# Patient Record
Sex: Male | Born: 1940 | Race: White | Hispanic: No | Smoking: Current some day smoker
Health system: Southern US, Community
[De-identification: ages and names within clinical notes are randomized; demographics above are authoritative.]

## PROBLEM LIST (undated history)

## (undated) DIAGNOSIS — H409 Unspecified glaucoma: Secondary | ICD-10-CM

## (undated) DIAGNOSIS — I10 Essential (primary) hypertension: Secondary | ICD-10-CM

## (undated) DIAGNOSIS — E785 Hyperlipidemia, unspecified: Secondary | ICD-10-CM

## (undated) HISTORY — PX: ANGIOPLASTY: SHX39

## (undated) HISTORY — DX: Unspecified glaucoma: H40.9

## (undated) HISTORY — PX: OTHER SURGICAL HISTORY: SHX169

## (undated) HISTORY — DX: Hyperlipidemia, unspecified: E78.5

## (undated) HISTORY — DX: Essential (primary) hypertension: I10

## (undated) HISTORY — PX: APPENDECTOMY: SHX54

---

## 2006-02-26 ENCOUNTER — Encounter: Admission: RE | Admit: 2006-02-26 | Discharge: 2006-02-26 | Payer: Self-pay | Admitting: Internal Medicine

## 2007-03-02 ENCOUNTER — Ambulatory Visit: Payer: Self-pay | Admitting: Internal Medicine

## 2007-03-10 ENCOUNTER — Ambulatory Visit: Payer: Self-pay | Admitting: Internal Medicine

## 2007-03-10 ENCOUNTER — Encounter: Payer: Self-pay | Admitting: Internal Medicine

## 2007-03-10 DIAGNOSIS — K5730 Diverticulosis of large intestine without perforation or abscess without bleeding: Secondary | ICD-10-CM | POA: Insufficient documentation

## 2007-03-10 DIAGNOSIS — D126 Benign neoplasm of colon, unspecified: Secondary | ICD-10-CM

## 2007-03-10 DIAGNOSIS — K648 Other hemorrhoids: Secondary | ICD-10-CM | POA: Insufficient documentation

## 2007-11-23 DIAGNOSIS — I10 Essential (primary) hypertension: Secondary | ICD-10-CM | POA: Insufficient documentation

## 2007-11-23 DIAGNOSIS — I2510 Atherosclerotic heart disease of native coronary artery without angina pectoris: Secondary | ICD-10-CM | POA: Insufficient documentation

## 2007-11-23 DIAGNOSIS — E785 Hyperlipidemia, unspecified: Secondary | ICD-10-CM

## 2008-01-06 IMAGING — CR DG ANKLE COMPLETE 3+V*R*
3 series · 3 of 3 positions shown · non-contrast
Comparison: None.

CLINICAL DATA: Pain and swelling.  Medial ankle pain for a couple of days.  Question gout.  Old fracture from many years ago.  No trauma history submitted.  
RIGHT ANKLE ? 3 VIEW:

[view not recorded (1 of 3)]
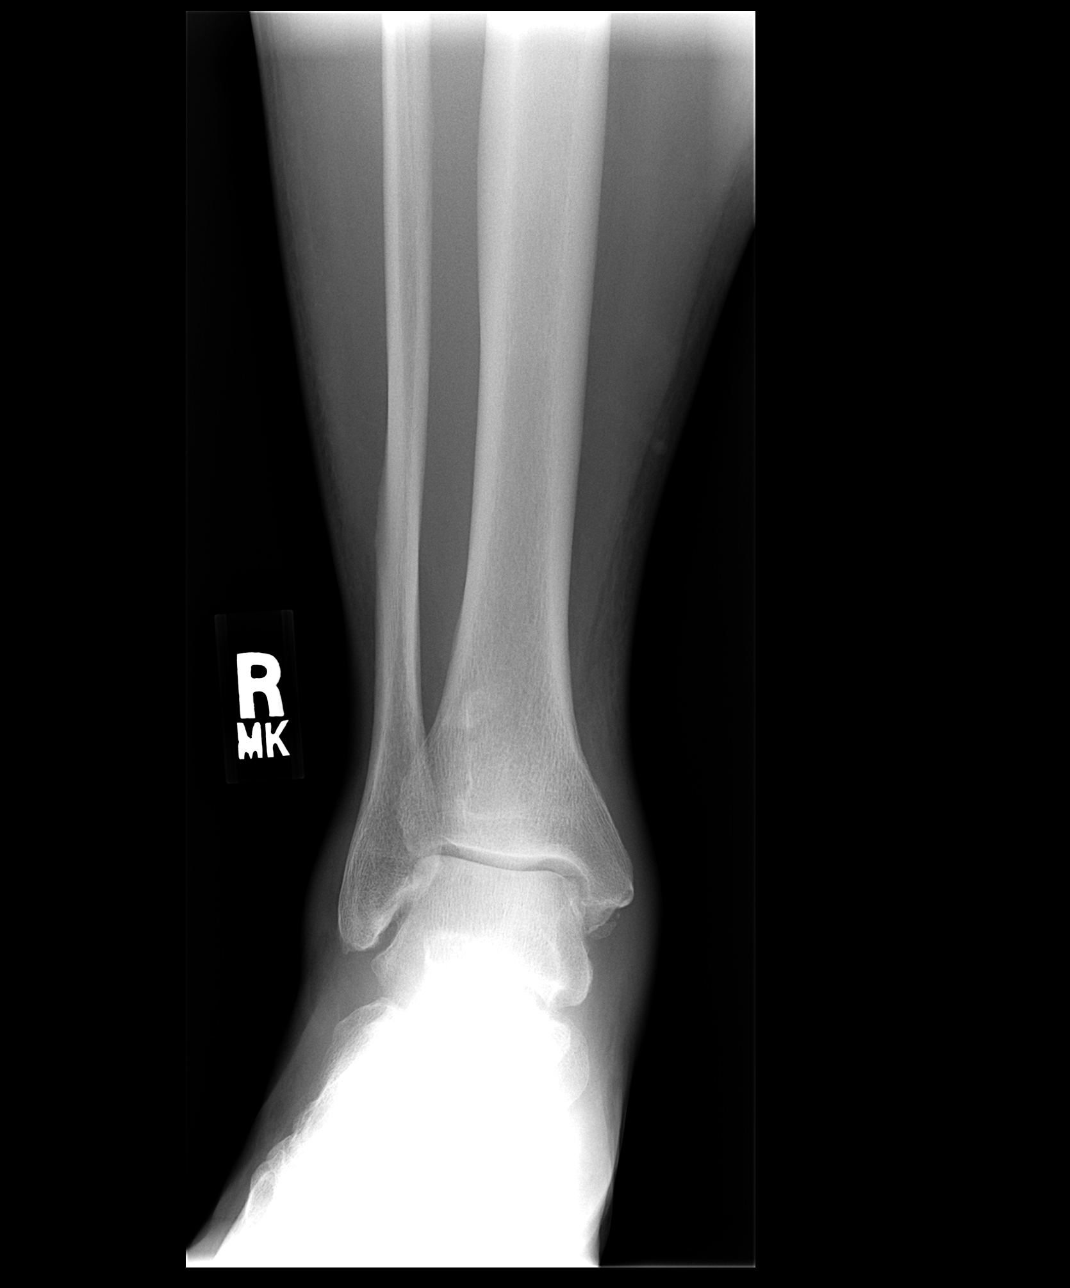

[view not recorded (2 of 3)]
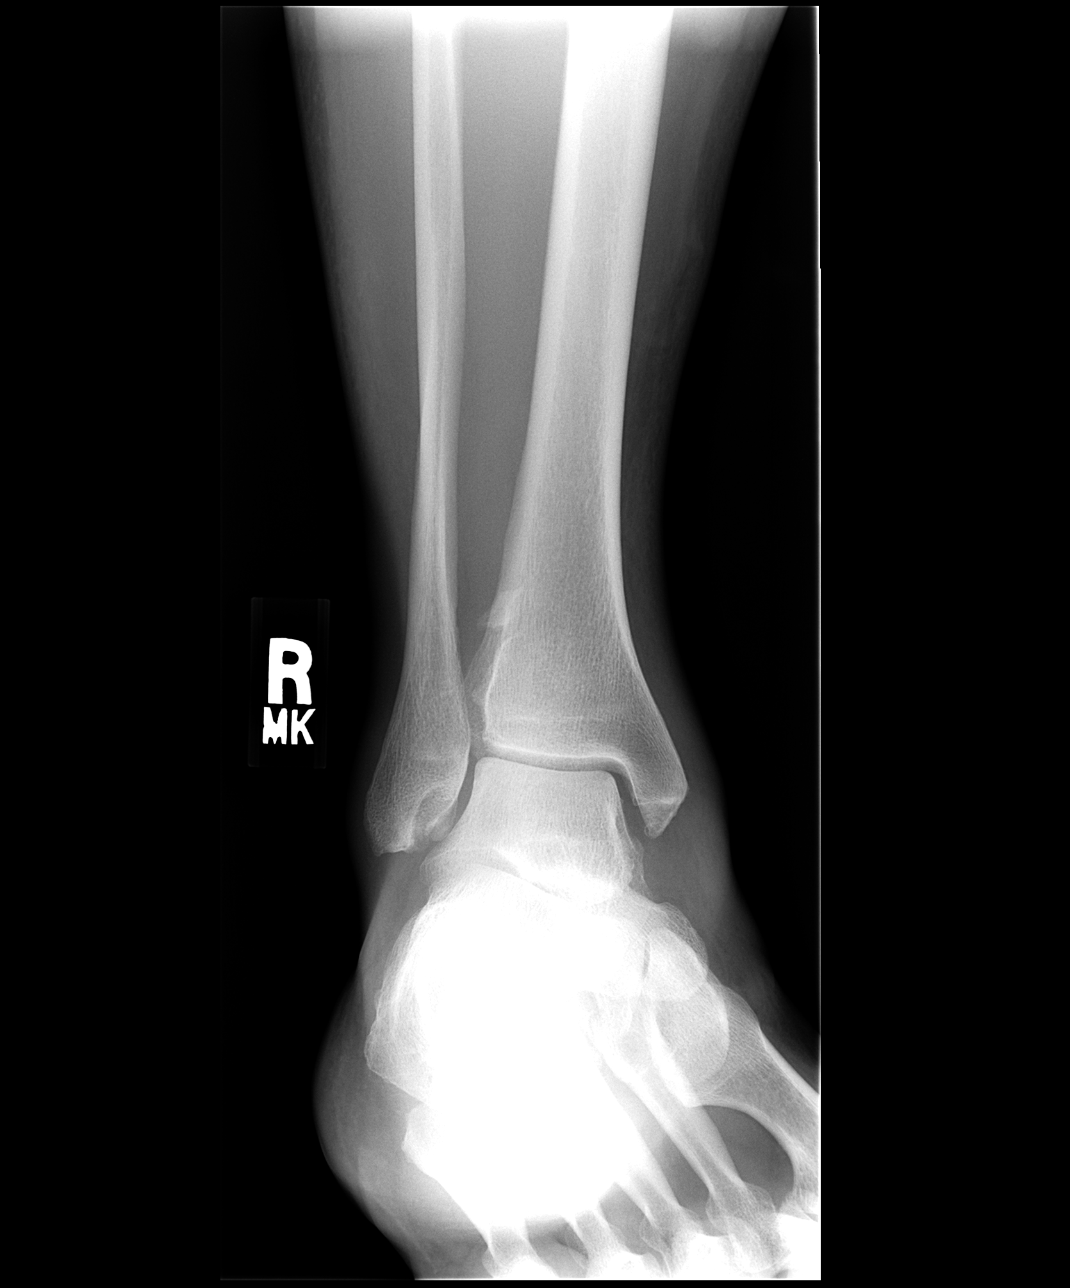

[view not recorded (3 of 3)]
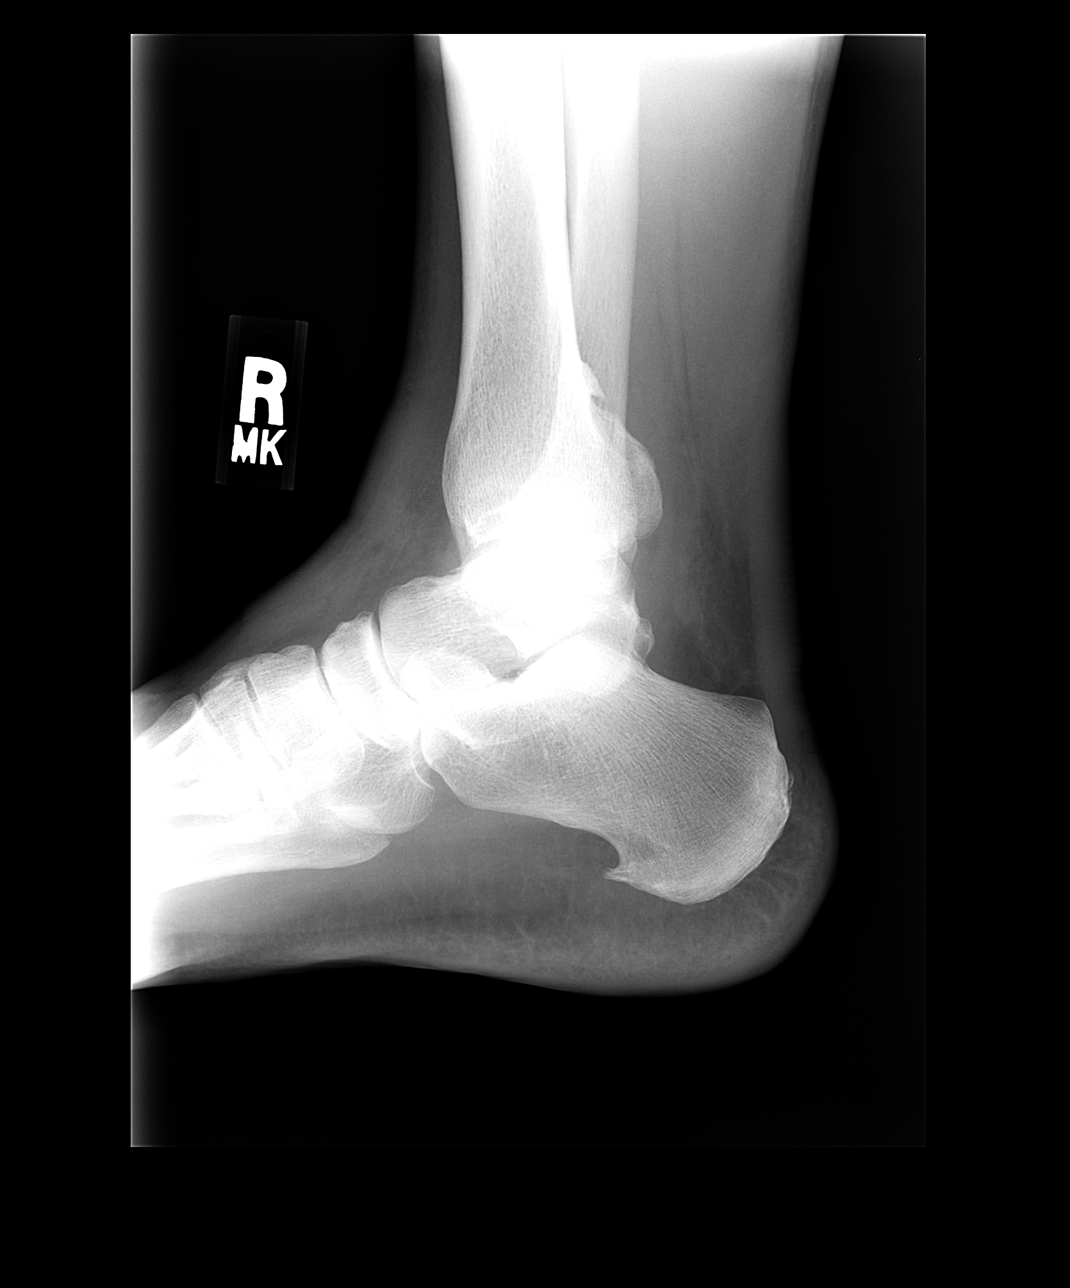

[3 of 3 positions shown; findings below may reference images not displayed]

FINDINGS: Frontal view demonstrates at least one small ossific fragment distal to the medial malleolus.  There may be mild soft tissue swelling about the medial malleolus.  On the mortise view, there is osseous irregularity about the lateral malleolus likely related to the reported history of remote trauma.  The talar dome is intact.  The base of the fifth metatarsal is intact.  There are no erosions .  Lateral view demonstrates a prominent exostosis off of the posterior distal tibia which could relate to the reported history of remote trauma.  There is no significant joint effusion.  A calcaneal spur is incidentally noted.
IMPRESSION: 1.  Small ossific fragments distal to the medial and lateral malleoli.  Clinical history describes no history of recent trauma.  Therefore, these are likely related to remote trauma.  There is a suggestion of mild medial  soft tissue swelling.  
2.  Exostosis off of the posterior aspect of the distal tibia is likely related to remote trauma.

## 2010-12-23 NOTE — Assessment & Plan Note (Signed)
[facility]                         GASTROENTEROLOGY OFFICE NOTE   SHIMON, TROWBRIDGE                       MRN:          [id number]  DATE:03/02/2007                            DOB:          1941-04-24    REASON FOR CONSULTATION:  Hemoccult positive stool.   HISTORY:  This is a 70 year old white male with a history of coronary  artery disease for which he has undergone coronary artery angioplasty on  multiple occasions.  He also has a history of hypertension and  hyperlipidemia.  He is referred through the courtesy of Dr. Felipa Eth  regarding Hemoccult positive stool identified on a routine outpatient  testing.  Patient tells me that he is not anemic.  His GI review of  systems is quite unremarkable.  He does have rare heartburn with dietary  indiscretion, particularly [place] foods.  Otherwise no problems with  nausea, vomiting, dysphagia, abdominal pain, change in bowel habits,  melena, or hematochezia.  His weight and appetite have been stable.  He  does report that he underwent colonoscopy in 1990 in [place], [place].  He is not exactly certain of the indication though felt the results were  okay.  No other prior GI history or GI evaluations.   PAST MEDICAL HISTORY:  1. Hypertension.  2. Coronary artery disease, status post angioplasty x4 in 1990's and      again in 1997.  3. Hyperlipidemia.   PAST SURGICAL HISTORY:  Status post appendectomy.   ALLERGIES:  NO KNOWN DRUG ALLERGIES.   CURRENT MEDICATIONS:  1. Diltiazem 300 mg p.o. q.a.m.  2. Atenolol 100 mg p.o. q.a.m.  3. Quinapril 40 mg p.o. q.a.m.  4. Diovan 160 mg p.o. q.a.m.  5. Hydrochlorothiazide 25 mg p.o. q.a.m.  6. Lipitor 40 mg p.o. q.a.m.  7. Zetia 10 mg p.o. q.p.m.  8. Multivitamin.  9. Baby aspirin.  10.Fish oil supplement.  11.B complex with zinc.   FAMILY HISTORY:  No family history of gastrointestinal malignancy or  colon polyps.   SOCIAL HISTORY:  Patient is divorced  without children, he lives alone.  He has a Master's Degree. 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  He smokes cigars, occasionally uses alcohol.   REVIEW OF SYSTEMS:  Per diagnostic evaluation form.   PHYSICAL EXAMINATION:  Well-appearing male in no acute distress.  Blood  pressure 132/86, his heart rate is 56 and regular, weight is 232 pounds.  He is 5 feet 10 inches in height.  HEENT:  Sclerae are anicteric, conjunctivae are pink, oral mucosa is  intact.  No adenopathy.  LUNGS:  Clear.  HEART:  Regular.  ABDOMEN:  Soft without tenderness, mass, or hernia.  Good bowel sounds  heard.  EXTREMITIES:  Without edema.   IMPRESSION:  This is a 70 year old gentleman with coronary artery  disease, hypertension, and hyperlipidemia who presents regarding  asymptomatic Hemoccult positive stool.   RECOMMENDATIONS:  1. Obtain hemoglobin to confirm that the patient is not anemic.  2. Schedule colonoscopy to exclude neoplasia as the cause  for occult      positive stool.  The nature of the procedure as well the risks,      benefits, and alternatives were discussed in detail.  He understood      and agreed to proceed.     Wilhemina Bonito. Marina Goodell, MD  Electronically Signed    JNP/MedQ  DD: 03/02/2007  DT: 03/02/2007  Job #: [id number]   cc:   Larina Earthly, M.D.

## 2012-03-18 ENCOUNTER — Encounter: Payer: Self-pay | Admitting: Internal Medicine

## 2012-10-18 ENCOUNTER — Ambulatory Visit (INDEPENDENT_AMBULATORY_CARE_PROVIDER_SITE_OTHER): Payer: 59 | Admitting: Internal Medicine

## 2012-10-18 VITALS — BP 128/76 | HR 68 | Temp 98.1°F | Resp 16 | Ht 70.0 in | Wt 247.2 lb

## 2012-10-18 NOTE — Progress Notes (Signed)
Patient ID: Delray Alt. MRN: [id number], DOB: 1941-02-14, 72 y.o. Date of Encounter: 10/18/2012, 2:38 PM   PROCEDURE NOTE: Verbal consent obtained. Sterile technique employed. Numbing: Local anesthesia obtained with 2% lidocaine plain.   Cleansed with soap and water. Irrigated.  Wound explored. Tendons intact but possible injury to digital nerve.  Wet to dry dressing applied. Refer to Orthopedics for further evaluation and possible digital nerve repair.   Rhoderick Moody, PA-C 10/18/2012 2:38 PM

## 2012-10-18 NOTE — Progress Notes (Signed)
  Subjective:    Patient ID: Ralph Alt., male    DOB: 02/11/1941, 72 y.o.   MRN: [id number]  HPIsliced thumb on broken glass 4 hr ago  Patient Active Problem List  Diagnosis  . ADENOMATOUS COLONIC POLYP  . HYPERLIPIDEMIA  . HYPERTENSION  . CAD  . INTERNAL HEMORRHOIDS  . DIVERTICULOSIS, COLON   Prior to Admission medications   Medication Sig Start Date End Date Taking? Authorizing Provider  atenolol (TENORMIN) 100 MG tablet Take 100 mg by mouth daily.   Yes Historical Provider, MD  atorvastatin (LIPITOR) 40 MG tablet Take 40 mg by mouth daily.   Yes Historical Provider, MD  colchicine 0.6 MG tablet Take 0.6 mg by mouth daily.   Yes Historical Provider, MD  ezetimibe (ZETIA) 10 MG tablet Take 10 mg by mouth daily.   Yes Historical Provider, MD  latanoprost (XALATAN) 0.005 % ophthalmic solution Place 1 drop into both eyes at bedtime.   Yes Historical Provider, MD  losartan-hydrochlorothiazide (HYZAAR) 100-25 MG per tablet Take 1 tablet by mouth daily.   Yes Historical Provider, MD  traMADol (ULTRAM) 50 MG tablet Take 50 mg by mouth every 6 (six) hours as needed for pain.   Yes Historical Provider, MD   Tetanus up-to-date as of 1 month ago at his primary care physical with [facility]  Review of Systems     Objective:   Physical Exam BP 128/76  Pulse 68  Temp(Src) 98.1 F (36.7 C) (Oral)  Resp 16  Ht 5\' 10"  (1.778 m)  Wt 247 lb 3.2 oz (112.129 kg)  BMI 35.47 kg/m2  SpO2 94% Laceration over the volar aspect of the first MCP exposing the extensor pollicis longus and brevis Minimal bleeding Thumb has good range of motion with no pain on opposed flexion /finger thumb opposition intact from a motor standpoint He has loss of 2 point discrimination over the radial aspect of the finger pad He has decrease interpretation of pin point in the same area  Procedure with the assistance of [facility] Marte=Following local infiltration of lidocaine the wound is  explored Both tendons appear to be intact/good hemostasis/bone not exposed       Assessment & Plan:  Problem #1 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

## 2012-11-17 ENCOUNTER — Encounter: Payer: Self-pay | Admitting: Internal Medicine

## 2013-08-31 ENCOUNTER — Encounter: Payer: Self-pay | Admitting: Internal Medicine

## 2013-08-31 ENCOUNTER — Telehealth: Payer: Self-pay | Admitting: Internal Medicine

## 2013-08-31 NOTE — Telephone Encounter (Signed)
error 

## 2013-10-04 ENCOUNTER — Ambulatory Visit (AMBULATORY_SURGERY_CENTER): Payer: Self-pay

## 2013-10-04 VITALS — Ht 69.0 in | Wt 235.0 lb

## 2013-10-04 DIAGNOSIS — Z860100 Personal history of colon polyps, unspecified: Secondary | ICD-10-CM

## 2013-10-04 MED ORDER — MOVIPREP 100 G PO SOLR: 1.0000 | Freq: Once | ORAL | Status: DC

## 2013-10-05 ENCOUNTER — Encounter: Payer: Self-pay | Admitting: Internal Medicine

## 2013-10-17 ENCOUNTER — Encounter: Payer: Self-pay | Admitting: Internal Medicine

## 2013-10-19 ENCOUNTER — Encounter: Payer: Self-pay | Admitting: Internal Medicine

## 2013-10-19 ENCOUNTER — Ambulatory Visit (AMBULATORY_SURGERY_CENTER): Payer: Medicare Other | Admitting: Internal Medicine

## 2013-10-19 VITALS — BP 148/89 | HR 51 | Temp 97.4°F | Resp 22 | Ht 69.0 in | Wt 235.0 lb

## 2013-10-19 DIAGNOSIS — D126 Benign neoplasm of colon, unspecified: Secondary | ICD-10-CM

## 2013-10-19 DIAGNOSIS — Z86010 Personal history of colonic polyps: Secondary | ICD-10-CM

## 2013-10-19 MED ORDER — SODIUM CHLORIDE 0.9 % IV SOLN: 500.0000 mL | INTRAVENOUS | Status: DC

## 2013-10-19 NOTE — Op Note (Signed)
[place]  [address] [place], [id number]   COLONOSCOPY PROCEDURE REPORT  PATIENT: Ralph, Harper  MR#: [id number] BIRTHDATE: 1941/02/05 , 60  yrs. old GENDER: Male ENDOSCOPIST: Eustace Quail, MD REFERRED JI:[id number] Program Recall PROCEDURE DATE:  10/19/2013 PROCEDURE:   Colonoscopy with snare polypectomy x 1 First Screening Colonoscopy - Avg.  risk and is 50 yrs.  old or older - No.  Prior Negative Screening - Now for repeat screening. N/A  History of Adenoma - Now for follow-up colonoscopy & has been > or = to 3 yrs.  Yes hx of adenoma.  Has been 3 or more years since last colonoscopy.  Polyps Removed Today? Yes. ASA CLASS:   Class II INDICATIONS:Patient's personal history of adenomatous colon polyps.Index 10-2006 (small TA and hpp) MEDICATIONS: MAC sedation, administered by CRNA and propofol (Diprivan) 200mg  IV  DESCRIPTION OF PROCEDURE:   After the risks benefits and alternatives of the procedure were thoroughly explained, informed consent was obtained.  A digital rectal exam revealed no abnormalities of the rectum.   The LB [id number]  endoscope was introduced through the anus and advanced to the cecum, which was identified by both the appendix and ileocecal valve. No adverse events experienced.   The quality of the prep was excellent, using MoviPrep  The instrument was then slowly withdrawn as the colon was fully examined.  COLON FINDINGS: A diminutive polyp was found in the sigmoid colon. A polypectomy was performed with a cold snare.  The resection was complete and the polyp tissue was completely retrieved.   Moderate diverticulosis was noted The finding was in the right colon and The finding was left colon.   The colon mucosa was otherwise normal. Retroflexed views revealed internal hemorrhoids. The time to cecum=1 minutes 53 seconds.  Withdrawal time=9 minutes 59 seconds. The scope was withdrawn and the procedure  completed. COMPLICATIONS: There were no complications.  ENDOSCOPIC IMPRESSION: 1.   Diminutive polyp was found in the sigmoid colon; polypectomy was performed with a cold snare 2.   Moderate diverticulosis was noted in the right colon and left colon 3.   The colon mucosa was otherwise normal  RECOMMENDATIONS: 1. Repeat colonoscopy in 5 years if polyp adenomatous; otherwise prn   eSigned:  Eustace Quail, MD 10/19/2013 9:35 AM   cc: Prince Solian, MD and The Patient   PATIENT NAME:  Ralph Harper. MR#: [id number]

## 2013-10-19 NOTE — Progress Notes (Signed)
Procedure ends, to recovery, report given and VSS. 

## 2013-10-19 NOTE — Progress Notes (Signed)
Called to room to assist during endoscopic procedure.  Patient ID and intended procedure confirmed with present staff. Received instructions for my participation in the procedure from the performing physician.  

## 2013-10-19 NOTE — Patient Instructions (Signed)
YOU HAD AN ENDOSCOPIC PROCEDURE TODAY AT THE [facility]: Refer to the procedure report that was given to you for any specific questions about what was found during the examination.  If the procedure report does not answer your questions, please call your gastroenterologist to clarify.  If you requested that your care partner not be given the details of your procedure findings, then the procedure report has been included in a sealed envelope for you to review at your convenience later.  YOU SHOULD EXPECT: Some feelings of bloating in the abdomen. Passage of more gas than usual.  Walking can help get rid of the air that was put into your GI tract during the procedure and reduce the bloating. If you had a lower endoscopy (such as a colonoscopy or flexible sigmoidoscopy) you may notice spotting of blood in your stool or on the toilet paper. If you underwent a bowel prep for your procedure, then you may not have a normal bowel movement for a few days.  DIET: Your first meal following the procedure should be a light meal and then it is ok to progress to your normal diet.  A half-sandwich or bowl of soup is an example of a good first meal.  Heavy or fried foods are harder to digest and may make you feel nauseous or bloated.  Likewise meals heavy in dairy and vegetables can cause extra gas to form and this can also increase the bloating.  Drink plenty of fluids but you should avoid alcoholic beverages for 24 hours.  ACTIVITY: Your care partner should take you home directly after the procedure.  You should plan to take it easy, moving slowly for the rest of the day.  You can resume normal activity the day after the procedure however you should NOT DRIVE or use heavy machinery for 24 hours (because of the sedation medicines used during the test).    SYMPTOMS TO REPORT IMMEDIATELY: A gastroenterologist can be reached at any hour.  During normal business hours, 8:30 AM to 5:00 PM Monday through Friday,  call [phone].  After hours and on weekends, please call the GI answering service at [phone] who will take a message and have the physician on call contact you.   Following lower endoscopy (colonoscopy or flexible sigmoidoscopy):  Excessive amounts of blood in the stool  Significant tenderness or worsening of abdominal pains  Swelling of the abdomen that is new, acute  Fever of 100F or higher  FOLLOW UP: If any biopsies were taken you will be contacted by phone or by letter within the next 1-3 weeks.  Call your gastroenterologist if you have not heard about the biopsies in 3 weeks.  Our staff will call the home number listed on your records the next business day following your procedure to check on you and address any questions or concerns that you may have at that time regarding the information given to you following your procedure. This is a courtesy call and so if there is no answer at the home number and we have not heard from you through the emergency physician on call, we will assume that you have returned to your regular daily activities without incident.  SIGNATURES/CONFIDENTIALITY: You and/or your care partner have signed paperwork which will be entered into your electronic medical record.  These signatures attest to the fact that that the information above on your After Visit Summary has been reviewed and is understood.  Full responsibility of the confidentiality of this   discharge information lies with you and/or your care-partner.  Polyp, diverticulosis, high fiber diet-handouts given  Repeat colonoscopy will be determined by pathology.   

## 2013-10-20 ENCOUNTER — Telehealth: Payer: Self-pay | Admitting: *Deleted

## 2013-10-20 ENCOUNTER — Encounter: Payer: Self-pay | Admitting: Internal Medicine

## 2013-10-20 NOTE — Telephone Encounter (Signed)
Error

## 2013-10-20 NOTE — Telephone Encounter (Signed)
Left message that we called for f/u 

## 2013-10-24 ENCOUNTER — Encounter: Payer: Self-pay | Admitting: Internal Medicine

## 2015-08-12 ENCOUNTER — Ambulatory Visit: Payer: Medicare Other

## 2018-06-30 ENCOUNTER — Encounter: Payer: Self-pay | Admitting: *Deleted

## 2018-07-12 ENCOUNTER — Encounter: Payer: Self-pay | Admitting: Cardiovascular Disease

## 2018-07-12 ENCOUNTER — Ambulatory Visit (INDEPENDENT_AMBULATORY_CARE_PROVIDER_SITE_OTHER): Payer: Medicare Other | Admitting: Cardiovascular Disease

## 2018-07-12 VITALS — BP 148/102 | HR 66 | Ht 70.0 in | Wt 258.0 lb

## 2018-07-12 DIAGNOSIS — Z7901 Long term (current) use of anticoagulants: Secondary | ICD-10-CM | POA: Diagnosis not present

## 2018-07-12 DIAGNOSIS — Z8739 Personal history of other diseases of the musculoskeletal system and connective tissue: Secondary | ICD-10-CM

## 2018-07-12 DIAGNOSIS — I2510 Atherosclerotic heart disease of native coronary artery without angina pectoris: Secondary | ICD-10-CM

## 2018-07-12 DIAGNOSIS — I4891 Unspecified atrial fibrillation: Secondary | ICD-10-CM

## 2018-07-12 DIAGNOSIS — R0683 Snoring: Secondary | ICD-10-CM

## 2018-07-12 DIAGNOSIS — E039 Hypothyroidism, unspecified: Secondary | ICD-10-CM

## 2018-07-12 DIAGNOSIS — Z6837 Body mass index (BMI) 37.0-37.9, adult: Secondary | ICD-10-CM

## 2018-07-12 DIAGNOSIS — I10 Essential (primary) hypertension: Secondary | ICD-10-CM | POA: Diagnosis not present

## 2018-07-12 LAB — CBC
HEMATOCRIT: 47.9 % (ref 37.5–51.0)
HEMOGLOBIN: 16.7 g/dL (ref 13.0–17.7)
MCH: 33.2 pg — ABNORMAL HIGH (ref 26.6–33.0)
MCHC: 34.9 g/dL (ref 31.5–35.7)
MCV: 95 fL (ref 79–97)
Platelets: 237 10*3/uL (ref 150–450)
RBC: 5.03 x10E6/uL (ref 4.14–5.80)
RDW: 13.6 % (ref 12.3–15.4)
WBC: 9.9 10*3/uL (ref 3.4–10.8)

## 2018-07-12 LAB — BASIC METABOLIC PANEL
BUN / CREAT RATIO: 14 (ref 10–24)
BUN: 14 mg/dL (ref 8–27)
CO2: 22 mmol/L (ref 20–29)
Calcium: 9.2 mg/dL (ref 8.6–10.2)
Chloride: 99 mmol/L (ref 96–106)
Creatinine, Ser: 0.98 mg/dL (ref 0.76–1.27)
GFR calc non Af Amer: 76 mL/min/{1.73_m2} (ref >59)
GFR, EST AFRICAN AMERICAN: 87 mL/min/{1.73_m2} (ref >59)
GLUCOSE: 92 mg/dL (ref 65–99)
Potassium: 4.8 mmol/L (ref 3.5–5.2)
Sodium: 143 mmol/L (ref 134–144)

## 2018-07-12 LAB — MAGNESIUM: Magnesium: 1.9 mg/dL (ref 1.6–2.3)

## 2018-07-12 NOTE — Patient Instructions (Signed)
Medication Instructions:  Your physician recommends that you continue on your current medications as directed. Please refer to the Current Medication list given to you today.  If you need a refill on your cardiac medications before your next appointment, please call your pharmacy.   Lab work: Today (BMET, Mag, CBC) If you have labs (blood work) drawn today and your tests are completely normal, you will receive your results only by: Marland Kitchen MyChart Message (if you have MyChart) OR . A paper copy in the mail If you have any lab test that is abnormal or we need to change your treatment, we will call you to review the results.  Testing/Procedures: Your physician has requested that you have an echocardiogram. Echocardiography is a painless test that uses sound waves to create images of your heart. It provides your doctor with information about the size and shape of your heart and how well your heart's chambers and valves are working. This procedure takes approximately one hour. There are no restrictions for this procedure. This will be done at our [facility] location:  [place] has recommended that you have a sleep study. This test records several body functions during sleep, including: brain activity, eye movement, oxygen and carbon dioxide blood levels, heart rate and rhythm, breathing rate and rhythm, the flow of air through your mouth and nose, snoring, body muscle movements, and chest and belly movement.  Follow-Up: At [facility], you and your health needs are our priority.  As part of our continuing mission to provide you with exceptional heart care, we have created designated Provider Care Teams.  These Care Teams include your primary Cardiologist (physician) and Advanced Practice Providers (APPs -  Physician Assistants and Nurse Practitioners) who all work together to provide you with the care you need, when you need it. . 08/25/17 at 3 pm with Dr.  Claiborne Billings

## 2018-07-12 NOTE — Progress Notes (Signed)
Cardiology Office Note    Date:  07/18/2018   ID:  Ralph Harper, DOB Aug 10, 1941, MRN [id number]  PCP:  Prince Solian, MD  Cardiologist:  Shelva Majestic, MD   Chief Complaint  Patient presents with  . New Patient (Initial Visit)    History of Present Illness:  Ralph Harper is a 77 y.o. male who presents to the office today to reestablish cardiology care.  I had initially seen him in 1990 and last saw him in the mid 94s.  I have not seen him in over 22 years.  He is referred by Ralph Harper after he was recently found to be in atrial fibrillation.  Ralph Harper is a 77 year old gentleman who I had initially seen in 1990 for evaluation of chest pain.  I do not have any of my medical records but fortunately I remember him.  I recall, when I had seen him with his symptomatology he was found to have abnormal T wave which I was worried may be consistent with inferior ischemia.  I had recommended definitive cardiac catheterization.  In the middle of the night prior to his test at home he had a dream and ultimately canceled his study.  I subsequently saw him for follow-up evaluation and with his symptomatology advised cardiac catheterization.  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  He had developed restenosis requiring repeat intervention several months later.  He tells me he has had a total of 4 interventions with his last one being in 1997 and involving a different vessel.  I apparently have not seen him since.  At the time, he had quit smoking.  He was beginning to exercise more regularly.  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  He has been followed by Dr. Dagmar Hait over the past 20 years and apparently has been without recurrent anginal symptomatology.  He has a history of hypertension, hypothyroidism, prior EtOH use, and has developed significant  weight since his prior evaluations with me.  He was seen by Dr. Carolynn Serve 1 year ago he was in sinus rhythm.  On his recent evaluation in September 2019 his ECG showed atrial fibrillation.  He was referred back for cardiology assessment.  Recently, [name] denies any recent chest pressure.  He notes occasional lower extremity edema.  He was having issues with his knees and essentially had stopped exercising.  Over the last 9 months he had undergone extensive dental work including crowns and implants.  His sleep is suboptimal.  He typically goes to bed at midnight and wakes up between 6 and 7 AM.  He snores.  His sleep is nonrestorative.  He has several episodes of nocturia at night.  When he was seen by Dr. Elsworth Soho he was started on Eliquis 5 mg twice a day take in addition to aspirin 81 mg.  He continues to be on atorvastatin 40 mg and Zetia 10 mg for hyperlipidemia.  He is on quinapril 40 mg daily, diltiazem 300 mg daily, valsartan HCT 160/12.5 in addition to atenolol 100 mg for hypertension.    Past Medical History:  Diagnosis Date  . Glaucoma   . Hyperlipidemia   . Hypertension     Past Surgical History:  Procedure Laterality Date  . ANGIOPLASTY    . APPENDECTOMY    . served nerve     hand 2014  Current Medications: Outpatient Medications Prior to Visit  Medication Sig Dispense Refill  . allopurinol (ZYLOPRIM) 100 MG tablet     . apixaban (ELIQUIS) 5 MG TABS tablet Take 5 mg by mouth daily.    Marland Kitchen aspirin 81 MG tablet Take 81 mg by mouth daily.    Marland Kitchen atenolol (TENORMIN) 100 MG tablet Take 100 mg by mouth daily.    Marland Kitchen atorvastatin (LIPITOR) 40 MG tablet Take 40 mg by mouth daily.    Marland Kitchen diltiazem (TIAZAC) 300 MG 24 hr capsule Take 300 mg by mouth daily.    Marland Kitchen ezetimibe (ZETIA) 10 MG tablet Take 10 mg by mouth daily.    Marland Kitchen latanoprost (XALATAN) 0.005 % ophthalmic solution Place 1 drop into both eyes at bedtime.    Marland Kitchen levothyroxine (SYNTHROID, LEVOTHROID) 50 MCG tablet Take 50 mcg by mouth daily  before breakfast.    . Multiple Vitamins-Minerals (MULTIVITAMIN PO) Take by mouth.    . Omega-3 Fatty Acids (FISH OIL) 1000 MG CAPS Take by mouth 2 (two) times daily.    . quinapril (ACCUPRIL) 40 MG tablet Take 40 mg by mouth at bedtime.    . Brinzolamide-Brimonidine (SIMBRINZA OP) Apply 1 drop to eye 2 (two) times daily.    . traMADol (ULTRAM) 50 MG tablet Take 50 mg by mouth every 6 (six) hours as needed for pain.    . valsartan-hydrochlorothiazide (DIOVAN-HCT) 320-25 MG per tablet     . valsartan-hydrochlorothiazide (DIOVAN-HCT) 160-12.5 MG tablet TK 1 T PO QD  1   No facility-administered medications prior to visit.      Allergies:   Patient has no known allergies.   Social History   Socioeconomic History  . Marital status: Divorced    Spouse name: Not on file  . Number of children: Not on file  . Years of education: Not on file  . Highest education level: Not on file  Occupational History  . Not on file  Social Needs  . Financial resource strain: Not on file  . Food insecurity:    Worry: Not on file    Inability: Not on file  . Transportation needs:    Medical: Not on file    Non-medical: Not on file  Tobacco Use  . Smoking status: Current Some Day Smoker  . Smokeless tobacco: Never Used  . Tobacco comment: cigars  Substance and Sexual Activity  . Alcohol use: Yes  . Drug use: No  . Sexual activity: Not on file  Lifestyle  . Physical activity:    Days per week: Not on file    Minutes per session: Not on file  . Stress: Not on file  Relationships  . Social connections:    Talks on phone: Not on file    Gets together: Not on file    Attends religious service: Not on file    Active member of club or organization: Not on file    Attends meetings of clubs or organizations: Not on file    Relationship status: Not on file  Other Topics Concern  . Not on file  Social History Narrative  . Not on file    Additional social history  is notable in that he is divorced  and was married 3 times.  He has stepchildren.  He retired in 1995 and previously had worked in a community college teaching history and [place].  There is occasional tobacco use.  He drinks beer occasionally.  Past he had exercise but most recently has not  exercised consistently.  Family History:  The patient's family history includes Asthma in his mother; CVA in his father; Congestive Heart Failure in his father; Diverticulitis in his father and mother; Glaucoma in his father and mother; Kidney failure in his father; Pneumonia in his mother.  A sister died at age 53.  ROS General: Negative; No fevers, chills, or night sweats;  HEENT: She has cataracts and glaucoma followed by Dr. Lenard Forth.  No hearing, sinus congestion, difficulty swallowing Pulmonary: Negative; No cough, wheezing, shortness of breath, hemoptysis Cardiovascular: See HPI GI: Negative; No nausea, vomiting, diarrhea, or abdominal pain GU: Negative; No dysuria, hematuria, or difficulty voiding Musculoskeletal: Negative; no myalgias, joint pain, or weakness Hematologic/Oncology: Negative; no easy bruising, bleeding Endocrine: Negative; no heat/cold intolerance; no diabetes Neuro: Negative; no changes in balance, headaches Skin: Negative; No rashes or skin lesions Psychiatric: Negative; No behavioral problems, depression Sleep: Nonrestorative sleep, snoring, daytime sleepiness; no bruxism, restless legs, hypnogognic hallucinations, no cataplexy Other comprehensive 14 point system review is negative.   PHYSICAL EXAM:   VS:  BP (!) 148/102   Pulse 66   Ht 5\' 10"  (1.778 m)   Wt 258 lb (117 kg)   BMI 37.02 kg/m     Repeat blood pressure by me was 140/90  Wt Readings from Last 3 Encounters:  07/12/18 258 lb (117 kg)  10/19/13 235 lb (106.6 kg)  10/04/13 235 lb (106.6 kg)    General: Alert, oriented, no distress.  Skin: normal turgor, no rashes, warm and dry HEENT: Normocephalic, atraumatic. Pupils equal round  and reactive to light; sclera anicteric; extraocular muscles intact; Fundi without hemorrhages or exudates. Nose without nasal septal hypertrophy Mouth/Parynx benign; Mallinpatti scale 3 Neck: Thick neck; no JVD, no carotid bruits; normal carotid upstroke Lungs: clear to ausculatation and percussion; no wheezing or rales Chest wall: Mild pectus excavatum; without tenderness to palpitation Heart: PMI not displaced, RRR, s1 s2 normal, 1/6 systolic murmur, no diastolic murmur, no rubs, gallops, thrills, or heaves Abdomen: Moderate central adiposity;  soft, nontender; no hepatosplenomehaly, BS+; abdominal aorta nontender and not dilated by palpation. Back: no CVA tenderness Pulses 2+ Musculoskeletal: full range of motion, normal strength, no joint deformities Extremities: Trace pretibial edema no clubbing cyanosis or edema, Homan's sign negative  Neurologic: grossly nonfocal; Cranial nerves grossly wnl Psychologic: Normal mood and affect   Studies/Labs Reviewed:   EKG:  EKG is ordered today.  ECG (independently read by me): Atrial fibrillation at 66 bpm.  Nonspecific ST changes.  Recent Labs: BMP Latest Ref Rng & Units 07/12/2018  Glucose 65 - 99 mg/dL 92  BUN 8 - 27 mg/dL 14  Creatinine 0.76 - 1.27 mg/dL 0.98  BUN/Creat Ratio 10 - 24 14  Sodium 134 - 144 mmol/L 143  Potassium 3.5 - 5.2 mmol/L 4.8  Chloride 96 - 106 mmol/L 99  CO2 20 - 29 mmol/L 22  Calcium 8.6 - 10.2 mg/dL 9.2     No flowsheet data found.  CBC Latest Ref Rng & Units 07/12/2018  WBC 3.4 - 10.8 x10E3/uL 9.9  Hemoglobin 13.0 - 17.7 g/dL 16.7  Hematocrit 37.5 - 51.0 % 47.9  Platelets 150 - 450 x10E3/uL 237   Lab Results  Component Value Date   MCV 95 07/12/2018   No results found for: TSH No results found for: HGBA1C   BNP No results found for: BNP  ProBNP No results found for: PROBNP   Lipid Panel  No results found for: CHOL, TRIG, HDL, CHOLHDL, VLDL, LDLCALC, LDLDIRECT  RADIOLOGY: No results  found.   Additional studies/ records that were reviewed today include:  I reviewed the medical records from Dr. Danne Baxter at [facility]:    1. Atrial fibrillation, unspecified type ([place])   2. Atherosclerosis of native coronary artery of native heart without angina pectoris   3. Essential hypertension   4. Current use of long term anticoagulation   5. Snoring   6. Class 2 severe obesity due to excess calories with serious comorbidity and body mass index (BMI) of 37.0 to 37.9 in adult ([place])   7. Hypothyroidism, unspecified type   8. History of gout      PLAN:  Jahson Emanuele is a 77 year old gentleman who I had cared for in the early 1990s.  At that time he underwent PTCA of his RCA totally may have developed additional disease leading to additional intervention in 1997.  He has a longstanding history of hypertension and has been on multiple drug regimen.  I have not seen him in over 22 years.  I had previously seen him he was trying to exercise regularly particularly since he had retired and also was traveling and had a special interest in Pharmacist, community.  He had a remote of significant tobacco use and ultimately quit smoking in 1986 may smoke intermittently since.  He states that he has had an extensive history of snoring for most of his life.  He has gained weight over the past 20 years and has not been exercising recently.  His blood pressure was initially taken by the nurse was significantly elevated.  On repeat by me was proved but still elevated.  Peers that he is on atenolol 100 mg, diltiazem 300 mg, valsartan HCT 160/12.5 and also may continue to be on quinapril.  I will obtain a 2D echo Doppler study to assess systolic and diastolic function and potential structural heart disease.  I await for his echo evaluation but anticipate adjusting his medications since at present there does not seem to be an indication for both ACE inhibition as well as angiotensin receptor blockade.   Most likely I anticipate discontinuing the ACE and further titrating valsartan HCT and if necessary adding spironolactone if needed for additional blood pressure control but will await for the above study and follow-up.  He was recently found to have atrial fibrillation of questionable duration.  His ECG from 1 year ago was reviewed by me which showed sinus bradycardia at 48 bpm on March 17, 2017.  His ECG today confirms atrial fibrillation at 66 bpm.  His rate is controlled.  He was unaware of noticing an irregular rhythm of atrial fibrillation when he had seen Dr. Dagmar Hait.  His AF rate is now controlled.  He is on Eliquis for anticoagulation and denies bleeding.  He admits to a longstanding history of snoring and with his progressive weight gain I have a high index of suspicion he has underlying obstructive sleep apnea.  I will also schedule him for a diagnostic polysomnogram for further evaluation.  I reviewed with him the risk consequences of untreated sleep apnea with reference to his cardiovascular health and particularly with reference to atrial fibrillation development and recurrence following successful cardioversion.  He is on atorvastatin and Zetia for hyperlipidemia.  Laboratory in September 2019 showed an LDL cholesterol at 60.  He is on levothyroxine for hypothyroidism at 50 mcg and recent TSH on June 20, 2018 was 2.9.  He has a history of gout and is on allopurinol.  I discussed the importance of weight loss and increased exercise.  We discussed exercising a minimum of 150 minutes/week of moderate intensity.  I will see him back in follow-up of the above studies and additional recommendations and treatment adjustment will be made at that time  Medication Adjustments/Labs and Tests Ordered: Current medicines are reviewed at length with the patient today.  Concerns regarding medicines are outlined above.  Medication changes, Labs and Tests ordered today are listed in the Patient Instructions  below. Patient Instructions  Medication Instructions:  Your physician recommends that you continue on your current medications as directed. Please refer to the Current Medication list given to you today.  If you need a refill on your cardiac medications before your next appointment, please call your pharmacy.   Lab work: Today (BMET, Mag, CBC) If you have labs (blood work) drawn today and your tests are completely normal, you will receive your results only by: Marland Kitchen MyChart Message (if you have MyChart) OR . A paper copy in the mail If you have any lab test that is abnormal or we need to change your treatment, we will call you to review the results.  Testing/Procedures: Your physician has requested that you have an echocardiogram. Echocardiography is a painless test that uses sound waves to create images of your heart. It provides your doctor with information about the size and shape of your heart and how well your heart's chambers and valves are working. This procedure takes approximately one hour. There are no restrictions for this procedure. This will be done at our [facility] location:  [place] has recommended that you have a sleep study. This test records several body functions during sleep, including: brain activity, eye movement, oxygen and carbon dioxide blood levels, heart rate and rhythm, breathing rate and rhythm, the flow of air through your mouth and nose, snoring, body muscle movements, and chest and belly movement.  Follow-Up: At [facility], you and your health needs are our priority.  As part of our continuing mission to provide you with exceptional heart care, we have created designated Provider Care Teams.  These Care Teams include your primary Cardiologist (physician) and Advanced Practice Providers (APPs -  Physician Assistants and Nurse Practitioners) who all work together to provide you with the care you need, when you need  it. . 08/25/17 at 3 pm with Dr. Claiborne Billings      Time spent: 60 minutes  Signed, Shelva Majestic, MD  07/18/2018 6:22 PM    [facility] [address], [place], [place], [place]  [address] Phone: [phone]

## 2018-07-13 ENCOUNTER — Telehealth: Payer: Self-pay | Admitting: *Deleted

## 2018-07-13 NOTE — Telephone Encounter (Signed)
Patient notified of sleep study appointment. 

## 2018-07-13 NOTE — Telephone Encounter (Signed)
-----   Message from Lawana Pai sent at 07/12/2018  4:45 PM EST ----- Regarding: sleep study Please precert and schedule. Thanks!

## 2018-07-18 ENCOUNTER — Encounter: Payer: Self-pay | Admitting: Cardiovascular Disease

## 2018-07-29 ENCOUNTER — Encounter: Payer: Self-pay | Admitting: *Deleted

## 2018-08-17 ENCOUNTER — Other Ambulatory Visit (HOSPITAL_COMMUNITY): Payer: Medicare Other

## 2018-08-25 ENCOUNTER — Ambulatory Visit: Payer: Medicare Other | Admitting: Cardiovascular Disease

## 2018-08-26 ENCOUNTER — Encounter (HOSPITAL_BASED_OUTPATIENT_CLINIC_OR_DEPARTMENT_OTHER): Payer: Medicare Other

## 2019-11-14 ENCOUNTER — Ambulatory Visit (INDEPENDENT_AMBULATORY_CARE_PROVIDER_SITE_OTHER): Payer: Medicare PPO | Admitting: Ophthalmology

## 2019-11-14 ENCOUNTER — Other Ambulatory Visit: Payer: Self-pay

## 2019-11-14 ENCOUNTER — Encounter (INDEPENDENT_AMBULATORY_CARE_PROVIDER_SITE_OTHER): Payer: Self-pay | Admitting: Ophthalmology

## 2019-11-14 DIAGNOSIS — H4051X2 Glaucoma secondary to other eye disorders, right eye, moderate stage: Secondary | ICD-10-CM | POA: Insufficient documentation

## 2019-11-14 DIAGNOSIS — H2513 Age-related nuclear cataract, bilateral: Secondary | ICD-10-CM | POA: Insufficient documentation

## 2019-11-14 DIAGNOSIS — L259 Unspecified contact dermatitis, unspecified cause: Secondary | ICD-10-CM | POA: Insufficient documentation

## 2019-11-14 DIAGNOSIS — H3411 Central retinal artery occlusion, right eye: Secondary | ICD-10-CM

## 2019-11-14 DIAGNOSIS — H35351 Cystoid macular degeneration, right eye: Secondary | ICD-10-CM

## 2019-11-14 DIAGNOSIS — H2512 Age-related nuclear cataract, left eye: Secondary | ICD-10-CM

## 2019-11-14 DIAGNOSIS — H34811 Central retinal vein occlusion, right eye, with macular edema: Secondary | ICD-10-CM | POA: Diagnosis not present

## 2019-11-14 DIAGNOSIS — H4051X3 Glaucoma secondary to other eye disorders, right eye, severe stage: Secondary | ICD-10-CM

## 2019-11-14 DIAGNOSIS — H348112 Central retinal vein occlusion, right eye, stable: Secondary | ICD-10-CM | POA: Insufficient documentation

## 2019-11-14 DIAGNOSIS — L233 Allergic contact dermatitis due to drugs in contact with skin: Secondary | ICD-10-CM

## 2019-11-14 HISTORY — DX: Age-related nuclear cataract, left eye: H25.12

## 2019-11-14 MED ORDER — AFLIBERCEPT 2MG/0.05ML IZ SOLN FOR KALEIDOSCOPE
2.0000 mg | INTRAVITREAL | Status: AC | PRN
  Administered 2019-11-14: 09:00:00 2 mg via INTRAVITREAL

## 2019-11-14 NOTE — Progress Notes (Signed)
11/14/2019     CHIEF COMPLAINT Patient presents for Conjunctivitis   HISTORY OF PRESENT ILLNESS: Ralph Harper is a 79 y.o. male who presents to the clinic today for:   HPI    Conjunctivitis    In right eye.  Characterized as blood shot.  Associated symptoms include itching, lid swelling and tearing.  Pain was noted as 0/10.  Present during allergy flare and eye rubbing.  Occurring constantly.  Duration of 1 week.  Since onset it is stable.          Comments    1 Week F/U OD, poss Eylea OD - OCT  Pt c/o redness and crusting after starting new eye drop x 1 week ago. Pt sts [facility] OD might be slightly better than 1 week ago. Pt denies ocular pain.       Last edited by Hurman Horn, MD on 11/14/2019  9:21 AM. (History)      Referring physician: Prince Solian, MD [place],  [place] [id number]  HISTORICAL INFORMATION:   Selected notes from the MEDICAL RECORD NUMBER    CURRENT MEDICATIONS: Current Outpatient Medications (Ophthalmic Drugs)  Medication Sig  . brimonidine (ALPHAGAN) 0.2 % ophthalmic solution Place 1 drop into the right eye 3 (three) times daily.  Marland Kitchen latanoprost (XALATAN) 0.005 % ophthalmic solution Place 1 drop into both eyes at bedtime.  Marland Kitchen TIMOPTIC 0.5 % ophthalmic solution Place 1 drop into the right eye 2 (two) times daily.   No current facility-administered medications for this visit. (Ophthalmic Drugs)   Current Outpatient Medications (Other)  Medication Sig  . allopurinol (ZYLOPRIM) 100 MG tablet   . allopurinol (ZYLOPRIM) 300 MG tablet   . apixaban (ELIQUIS) 5 MG TABS tablet Take 5 mg by mouth daily.  Marland Kitchen aspirin 81 MG tablet Take 81 mg by mouth daily.  Marland Kitchen atenolol (TENORMIN) 100 MG tablet Take 100 mg by mouth daily.  Marland Kitchen atorvastatin (LIPITOR) 40 MG tablet Take 40 mg by mouth daily.  Marland Kitchen diltiazem (TIAZAC) 300 MG 24 hr capsule Take 300 mg by mouth daily.  Marland Kitchen ezetimibe (ZETIA) 10 MG tablet Take 10 mg by mouth daily.  Marland Kitchen levothyroxine (SYNTHROID,  LEVOTHROID) 50 MCG tablet Take 50 mcg by mouth daily before breakfast.  . Multiple Vitamins-Minerals (MULTIVITAMIN PO) Take by mouth.  . Omega-3 Fatty Acids (FISH OIL) 1000 MG CAPS Take by mouth 2 (two) times daily.  . quinapril (ACCUPRIL) 40 MG tablet Take 40 mg by mouth at bedtime.  . valsartan-hydrochlorothiazide (DIOVAN-HCT) 160-12.5 MG tablet TK 1 T PO QD   No current facility-administered medications for this visit. (Other)      REVIEW OF SYSTEMS: ROS    Negative for: Constitutional, Gastrointestinal, Neurological, Skin, Genitourinary, Musculoskeletal, HENT, Endocrine, Cardiovascular, Eyes, Respiratory, Psychiatric, Allergic/Imm, Heme/Lymph   Last edited by Hurman Horn, MD on 11/14/2019  9:03 AM. (History)       ALLERGIES No Known Allergies  PAST MEDICAL HISTORY Past Medical History:  Diagnosis Date  . Glaucoma   . Hyperlipidemia   . Hypertension    Past Surgical History:  Procedure Laterality Date  . ANGIOPLASTY    . APPENDECTOMY    . served nerve     hand 2014    FAMILY HISTORY Family History  Problem Relation Age of Onset  . Pneumonia Mother   . Asthma Mother   . Diverticulitis Mother   . Glaucoma Mother   . Congestive Heart Failure Father   . CVA Father   .  Kidney failure Father   . Glaucoma Father   . Diverticulitis Father   . Colon cancer Neg Hx   . Pancreatic cancer Neg Hx   . Stomach cancer Neg Hx     SOCIAL HISTORY Social History   Tobacco Use  . Smoking status: Current Some Day Smoker  . Smokeless tobacco: Never Used  . Tobacco comment: cigars  Substance Use Topics  . Alcohol use: Yes  . Drug use: No         OPHTHALMIC EXAM: Base Eye Exam    Visual Acuity (Snellen - Linear)      Right Left   Dist cc CF @ 5' 20/50 +2   Dist ph cc NI NI   Correction: Glasses       Tonometry (Tonopen, 8:32 AM)      Right Left   Pressure 40 22       Pupils      Pupils Dark Light Shape React APD   Right PERRL 4 3 Round Slow None   Left  PERRL 4 3 Round Slow None       Visual Fields (Counting fingers)      Left Right    Full Full       Extraocular Movement      Right Left    Full Full       Neuro/Psych    Oriented x3: Yes   Mood/Affect: Normal       Dilation    Right eye: 1.0% Mydriacyl, 2.5% Phenylephrine @ 8:33 AM        Slit Lamp and Fundus Exam    External Exam      Right Left   External Edema, Periorbital edema Normal          IMAGING AND PROCEDURES  Imaging and Procedures for 11/14/19  OCT, Retina - OU - Both Eyes       Right Eye Scan locations included subfoveal. Findings include subretinal scarring, macular pucker.   Left Eye Quality was good. Scan locations included subfoveal. Findings include normal observations, normal foveal contour.   Notes Much less cystoid macular edema.Central retinal vein occlusion is under control since onset of therapy June 2020.  Nonetheless neovascular changes are present in the eye and this will require ongoing antiveg F and likely panretinal photocoagulation to the peripheral nonperfusion.       Intravitreal Injection, Pharmacologic Agent - OD - Right Eye       Time Out 11/14/2019. 9:05 AM. Confirmed correct patient, procedure, site, and patient consented.   Anesthesia Topical anesthesia was used Rosalyn Gess).   Procedure Preparation included 5% betadine to ocular surface, 10% betadine to eyelids. A 30 gauge needle was used.   Injection:  2 mg aflibercept [name] SOLN   NDC: [id number], Lot: [id number]   Route: Intravitreal, Site: Right Eye, Waste: 0 mg  Post-op Post injection exam found visual acuity of at least counting fingers. The patient tolerated the procedure well. There were no complications. The patient received written and verbal post procedure care education. Post injection medications were not given.        Aspiration of Aqueous, Diagnostic - OD - Right Eye       Diagnosis:   Neovascular glaucoma  Sterile preparation with  topical antibiotics and Betadine completed.  Lid speculum applied and 0.1 cc of anterior chamber aqueous removed atraumatically.  Intraocular blood pressure lower : Less than 20 by tactile measure.  ASSESSMENT/PLAN:    ICD-10-CM   1. 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 Neovascular glaucoma, right eye, severe stage  H40.51X3 Aspiration of Aqueous, Diagnostic - OD - Right Eye  3. Nuclear sclerotic cataract of both eyes  H25.13   4. Cystoid macular edema of right eye  H35.351   5. Central retinal artery occlusion of right eye  H34.11   6. 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  2.  3.  Ophthalmic Meds Ordered this visit:  Meds ordered this encounter  Medications  . aflibercept (EYLEA) SOLN 2 mg       Return in about 2 weeks (around 11/28/2019) for PRP, OD.  Patient Instructions  Patient will hold on brimonidine use.    Explained the diagnoses, plan, and follow up with the patient and they expressed understanding.  Patient expressed understanding of the importance of proper follow up care.   Clent Demark Nyzier Boivin M.D. Diseases & Surgery of the Retina and Vitreous Retina & Diabetic [place] 11/14/19     Abbreviations: M myopia (nearsighted); A astigmatism; H hyperopia (farsighted); P presbyopia; Mrx spectacle prescription;  CTL contact lenses; OD right eye; OS left eye; OU both eyes  XT exotropia; ET esotropia; PEK punctate epithelial keratitis; PEE punctate epithelial erosions; DES dry eye syndrome; MGD meibomian gland dysfunction; ATs artificial tears; PFAT's preservative free artificial tears; [place] nuclear sclerotic cataract; PSC posterior subcapsular cataract; ERM epi-retinal membrane; PVD posterior vitreous detachment; RD retinal detachment; DM  diabetes mellitus; DR diabetic retinopathy; NPDR non-proliferative diabetic retinopathy; PDR proliferative diabetic retinopathy; CSME clinically significant macular edema; DME diabetic macular edema; dbh dot blot hemorrhages; CWS cotton wool spot; POAG primary open angle glaucoma; C/D cup-to-disc ratio; HVF humphrey visual field; GVF goldmann visual field; OCT optical coherence tomography; IOP intraocular pressure; BRVO Branch retinal vein occlusion; CRVO central retinal vein occlusion; CRAO central retinal artery occlusion; BRAO branch retinal artery occlusion; RT retinal tear; SB scleral buckle; PPV pars plana vitrectomy; VH Vitreous hemorrhage; PRP panretinal laser photocoagulation; IVK intravitreal kenalog; VMT vitreomacular traction; MH Macular hole;  NVD neovascularization of the disc; NVE neovascularization elsewhere; AREDS age related eye disease study; ARMD age related macular degeneration; POAG primary open angle glaucoma; EBMD epithelial/anterior basement membrane dystrophy; ACIOL anterior chamber intraocular lens; IOL intraocular lens; PCIOL posterior chamber intraocular lens; Phaco/IOL phacoemulsification with intraocular lens placement; [place] photorefractive keratectomy; LASIK laser assisted in situ keratomileusis; HTN hypertension; DM diabetes mellitus; COPD chronic obstructive pulmonary disease

## 2019-11-14 NOTE — Patient Instructions (Signed)
Patient will hold on brimonidine use.

## 2019-11-28 ENCOUNTER — Encounter (INDEPENDENT_AMBULATORY_CARE_PROVIDER_SITE_OTHER): Payer: Self-pay | Admitting: Ophthalmology

## 2019-11-28 ENCOUNTER — Other Ambulatory Visit: Payer: Self-pay

## 2019-11-28 ENCOUNTER — Ambulatory Visit (INDEPENDENT_AMBULATORY_CARE_PROVIDER_SITE_OTHER): Payer: Medicare PPO | Admitting: Ophthalmology

## 2019-11-28 DIAGNOSIS — H348111 Central retinal vein occlusion, right eye, with retinal neovascularization: Secondary | ICD-10-CM | POA: Insufficient documentation

## 2019-11-28 DIAGNOSIS — H34811 Central retinal vein occlusion, right eye, with macular edema: Secondary | ICD-10-CM

## 2019-11-28 NOTE — Assessment & Plan Note (Signed)
The nature of central retinal vein occlusion was discussed with the patient including the division of types into nonischemic ischemic. The potential sequelae of ischemic central retinal vein occlusion, including macular edema, neovascularization, rubeosis iridis, and neovascular glaucoma, were discussed, and the need for frequent follow-up.  The nature of macular edema and central retinal vein occlusion was discussed. The following options were considered:  1.Observation for a period to look for spontaneous improvement, is no linger the primary therapy. One-third worsen, one-third stay unchanged, and one-third improves.  2. Anti-VEGF Therapy. ( Lucentis, Avastin or Eylea ) injected  in intravitreal fashion, initially monthly then tailored to clinical response.  3. Intravitreal steroid usage, Kenalog, or Ozurdex, usually a second line therapy or in combination with anti-Vegf therapy noted above.  4. Panretinal laser photocoagulation to cause regression of iris neovascularization, or treat retinal  non-perfusion.  5. Surgical Management may include vitrectomy with incisions of peripheral veins to trigger retino choroidal anastomosis formation. This topic presented and discussed at [place].

## 2019-11-28 NOTE — Progress Notes (Signed)
11/28/2019     CHIEF COMPLAINT Patient presents for Retina Follow Up   HISTORY OF PRESENT ILLNESS: Ralph Harper is a 79 y.o. male who presents to the clinic today for:   HPI    Retina Follow Up    Patient presents with  CRVO/BRVO.  In right eye.  Severity is moderate.  Since onset it is stable.  I, the attending physician,  performed the HPI with the patient and updated documentation appropriately.          Comments    PRP OD  Pt states vision is stable. Denies any changes.       Last edited by Tilda Franco on 11/28/2019 10:27 AM. (History)      Referring physician: Prince Solian, MD [place],   [id number]  HISTORICAL INFORMATION:   Selected notes from the MEDICAL RECORD NUMBER       CURRENT MEDICATIONS: Current Outpatient Medications (Ophthalmic Drugs)  Medication Sig  . brimonidine (ALPHAGAN) 0.2 % ophthalmic solution Place 1 drop into the right eye 3 (three) times daily.  Marland Kitchen latanoprost (XALATAN) 0.005 % ophthalmic solution Place 1 drop into both eyes at bedtime.  Marland Kitchen TIMOPTIC 0.5 % ophthalmic solution Place 1 drop into the right eye 2 (two) times daily.   No current facility-administered medications for this visit. (Ophthalmic Drugs)   Current Outpatient Medications (Other)  Medication Sig  . allopurinol (ZYLOPRIM) 100 MG tablet   . allopurinol (ZYLOPRIM) 300 MG tablet   . apixaban (ELIQUIS) 5 MG TABS tablet Take 5 mg by mouth daily.  Marland Kitchen aspirin 81 MG tablet Take 81 mg by mouth daily.  Marland Kitchen atenolol (TENORMIN) 100 MG tablet Take 100 mg by mouth daily.  Marland Kitchen atorvastatin (LIPITOR) 40 MG tablet Take 40 mg by mouth daily.  Marland Kitchen diltiazem (TIAZAC) 300 MG 24 hr capsule Take 300 mg by mouth daily.  Marland Kitchen ezetimibe (ZETIA) 10 MG tablet Take 10 mg by mouth daily.  Marland Kitchen levothyroxine (SYNTHROID, LEVOTHROID) 50 MCG tablet Take 50 mcg by mouth daily before breakfast.  . Multiple Vitamins-Minerals (MULTIVITAMIN PO) Take by mouth.  . Omega-3 Fatty Acids (FISH  OIL) 1000 MG CAPS Take by mouth 2 (two) times daily.  . quinapril (ACCUPRIL) 40 MG tablet Take 40 mg by mouth at bedtime.  . valsartan-hydrochlorothiazide (DIOVAN-HCT) 160-12.5 MG tablet TK 1 T PO QD   No current facility-administered medications for this visit. (Other)      REVIEW OF SYSTEMS:    ALLERGIES No Known Allergies  PAST MEDICAL HISTORY Past Medical History:  Diagnosis Date  . Glaucoma   . Hyperlipidemia   . Hypertension    Past Surgical History:  Procedure Laterality Date  . ANGIOPLASTY    . APPENDECTOMY    . served nerve     hand 2014    FAMILY HISTORY Family History  Problem Relation Age of Onset  . Pneumonia Mother   . Asthma Mother   . Diverticulitis Mother   . Glaucoma Mother   . Congestive Heart Failure Father   . CVA Father   . Kidney failure Father   . Glaucoma Father   . Diverticulitis Father   . Colon cancer Neg Hx   . Pancreatic cancer Neg Hx   . Stomach cancer Neg Hx     SOCIAL HISTORY Social History   Tobacco Use  . Smoking status: Current Some Day Smoker  . Smokeless tobacco: Never Used  . Tobacco comment: cigars  Substance Use Topics  .  Alcohol use: Yes  . Drug use: No         OPHTHALMIC EXAM:  Base Eye Exam    Visual Acuity (Snellen - Linear)      Right Left   Dist cc E Card @ 4' 20/50   Dist ph cc NI 20/30 -1   Correction: Glasses       Tonometry (Tonopen, 10:36 AM)      Right Left   Pressure 15 19       Pupils      Pupils Dark Light Shape React APD   Right PERRL 4 3 Round Slow None   Left PERRL 4 3 Round Slow None       Neuro/Psych    Oriented x3: Yes   Mood/Affect: Normal       Dilation    Right eye: 1.0% Mydriacyl, 2.5% Phenylephrine @ 10:36 AM        Slit Lamp and Fundus Exam    External Exam      Right Left   External Normal Normal       Slit Lamp Exam      Right Left   Lids/Lashes Normal    Conjunctiva/Sclera White and quiet    Cornea Clear    Anterior Chamber Deep and quiet     Iris No rubeosis seen    Lens 2+ Nuclear sclerosis    Anterior Vitreous Normal           IMAGING AND PROCEDURES  Imaging and Procedures for 11/28/19  Panretinal Photocoagulation - OD - Right Eye       Time Out Confirmed correct patient, procedure, site, and patient consented.   Anesthesia Topical anesthesia was used. Anesthetic medications included Proparacaine 0.5%.   Laser Information The type of laser was diode. Color was yellow. The duration in seconds was 0.03. The spot size was 390 microns. Laser power was 260. Total spots was 1426.   Post-op The patient tolerated the procedure well. There were no complications. The patient received written and verbal post procedure care education.   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 The potential sequelae of ischemic central retinal vein occlusion, including macular edema, neovascularization, rubeosis iridis, and neovascular glaucoma, were discussed, and the need for frequent follow-up.  The nature of macular edema and central retinal vein occlusion was discussed. The following options were considered:  1.Observation for a period to look for spontaneous improvement, is no linger the primary therapy. One-third worsen, one-third stay unchanged, and one-third improves.  2. Anti-VEGF Therapy. ( Lucentis, Avastin or Eylea ) injected  in intravitreal fashion, initially monthly then tailored to clinical response.  3. Intravitreal steroid usage, Kenalog, or Ozurdex, usually a second line therapy or in combination with anti-Vegf therapy noted above.  4. Panretinal laser photocoagulation to cause regression of iris neovascularization, or treat retinal   non-perfusion.  5. Surgical Management may include vitrectomy with incisions of peripheral veins to trigger retino choroidal anastomosis formation. This topic presented and discussed at [place].       ICD-10-CM   1. 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 Central retinal vein  occlusion with macular edema of right eye  H34.8110 CANCELED: Panretinal Photocoagulation - OD - Right Eye    1.  2.  3.  Ophthalmic Meds Ordered this visit:  No orders of the defined types were placed in this encounter.      Return in about 3 weeks (around 12/19/2019) for EYLEA OCT, OD.  There are no Patient Instructions on file for this visit.   Explained the diagnoses, plan, and follow up with the patient and they expressed understanding.  Patient expressed understanding of the importance of proper follow up care.   Clent Demark Shavawn Stobaugh M.D. Diseases & Surgery of the Retina and Vitreous Retina & Diabetic [place] 11/28/19     Abbreviations: M myopia (nearsighted); A astigmatism; H hyperopia (farsighted); P presbyopia; Mrx spectacle prescription;  CTL contact lenses; OD right eye; OS left eye; OU both eyes  XT exotropia; ET esotropia; PEK punctate epithelial keratitis; PEE punctate epithelial erosions; DES dry eye syndrome; MGD meibomian gland dysfunction; ATs artificial tears; PFAT's preservative free artificial tears; [place] nuclear sclerotic cataract; PSC posterior subcapsular cataract; ERM epi-retinal membrane; PVD posterior vitreous detachment; RD retinal detachment; DM diabetes mellitus; DR diabetic retinopathy; NPDR non-proliferative diabetic retinopathy; PDR proliferative diabetic retinopathy; CSME clinically significant macular edema; DME diabetic macular edema; dbh dot blot hemorrhages; CWS cotton wool spot; POAG primary open angle glaucoma; C/D cup-to-disc ratio; HVF humphrey visual field; GVF goldmann visual  field; OCT optical coherence tomography; IOP intraocular pressure; BRVO Branch retinal vein occlusion; CRVO central retinal vein occlusion; CRAO central retinal artery occlusion; BRAO branch retinal artery occlusion; RT retinal tear; SB scleral buckle; PPV pars plana vitrectomy; VH Vitreous hemorrhage; PRP panretinal laser photocoagulation; IVK intravitreal kenalog; VMT vitreomacular traction; MH Macular hole;  NVD neovascularization of the disc; NVE neovascularization elsewhere; AREDS age related eye disease study; ARMD age related macular degeneration; POAG primary open angle glaucoma; EBMD epithelial/anterior basement membrane dystrophy; ACIOL anterior chamber intraocular lens; IOL intraocular lens; PCIOL posterior chamber intraocular lens; Phaco/IOL phacoemulsification with intraocular lens placement; [place] photorefractive keratectomy; LASIK laser assisted in situ keratomileusis; HTN hypertension; DM diabetes mellitus; COPD chronic obstructive pulmonary disease

## 2019-12-18 ENCOUNTER — Encounter (INDEPENDENT_AMBULATORY_CARE_PROVIDER_SITE_OTHER): Payer: Self-pay | Admitting: Ophthalmology

## 2019-12-18 ENCOUNTER — Ambulatory Visit (INDEPENDENT_AMBULATORY_CARE_PROVIDER_SITE_OTHER): Payer: Medicare PPO | Admitting: Ophthalmology

## 2019-12-18 ENCOUNTER — Other Ambulatory Visit: Payer: Self-pay

## 2019-12-18 DIAGNOSIS — H4051X3 Glaucoma secondary to other eye disorders, right eye, severe stage: Secondary | ICD-10-CM | POA: Diagnosis not present

## 2019-12-18 DIAGNOSIS — H34811 Central retinal vein occlusion, right eye, with macular edema: Secondary | ICD-10-CM | POA: Diagnosis not present

## 2019-12-18 MED ORDER — BRIMONIDINE TARTRATE 0.2 % OP SOLN: 1.0000 [drp] | Freq: Three times a day (TID) | OPHTHALMIC | 0 refills | Status: DC

## 2019-12-18 MED ORDER — AFLIBERCEPT 2MG/0.05ML IZ SOLN FOR KALEIDOSCOPE
2.0000 mg | INTRAVITREAL | Status: AC | PRN
  Administered 2019-12-18: 2 mg via INTRAVITREAL

## 2019-12-18 NOTE — Assessment & Plan Note (Signed)
   OD, with no evidence of retinal neovascularization no iris neovascularization and CME now improved. The nature of central retinal vein occlusion was discussed with the patient including the division of types into nonischemic ischemic. The potential sequelae of ischemic central retinal vein occlusion, including macular edema, neovascularization, rubeosis iridis, and neovascular glaucoma, were discussed, and the need for frequent follow-up.  The nature of macular edema and central retinal vein occlusion was discussed. The following options were considered:  1.Observation for a period to look for spontaneous improvement, is no linger the primary therapy. One-third worsen, one-third stay unchanged, and one-third improves.  2. Anti-VEGF Therapy. ( Lucentis, Avastin or Eylea ) injected  in intravitreal fashion, initially monthly then tailored to clinical response.  3. Intravitreal steroid usage, Kenalog, or Ozurdex, usually a second line therapy or in combination with anti-Vegf therapy noted above.  4. Panretinal laser photocoagulation to cause regression of iris neovascularization, or treat retinal  non-perfusion.  5. Surgical Management may include vitrectomy with incisions of peripheral veins to trigger retino choroidal anastomosis formation. This topic presented and discussed at [place].   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

## 2019-12-18 NOTE — Assessment & Plan Note (Signed)
This condition worsened off brimonidine with normal intraocular pressure of 15 some 2 and half weeks previous.  Will resume brimonidine topically OD 3 times daily

## 2019-12-18 NOTE — Progress Notes (Signed)
12/18/2019     CHIEF COMPLAINT Patient presents for Retina Follow Up   HISTORY OF PRESENT ILLNESS: Ralph Harper is a 79 y.o. male who presents to the clinic today for:   HPI    Retina Follow Up    Patient presents with  CRVO/BRVO.  In right eye.  This started 4 weeks ago.  Severity is mild.  Duration of 4 weeks.  Since onset it is stable.          Comments    4 Week CRVO F/U OD, poss Eylea OD  Pt denies noticeable changes to [place] OU since last visit. Pt denies ocular pain, flashes of light, or floaters OU.         Last edited by Rockie Neighbours, [place] on 12/18/2019  8:08 AM. (History)      Referring physician: Prince Solian, MD [place],  [place] [address]  HISTORICAL INFORMATION:   Selected notes from the MEDICAL RECORD NUMBER       CURRENT MEDICATIONS: Current Outpatient Medications (Ophthalmic Drugs)  Medication Sig  . brimonidine (ALPHAGAN) 0.2 % ophthalmic solution Place 1 drop into the right eye 3 (three) times daily.  Marland Kitchen latanoprost (XALATAN) 0.005 % ophthalmic solution Place 1 drop into both eyes at bedtime.  Marland Kitchen TIMOPTIC 0.5 % ophthalmic solution Place 1 drop into the right eye 2 (two) times daily.   No current facility-administered medications for this visit. (Ophthalmic Drugs)   Current Outpatient Medications (Other)  Medication Sig  . allopurinol (ZYLOPRIM) 100 MG tablet   . allopurinol (ZYLOPRIM) 300 MG tablet   . apixaban (ELIQUIS) 5 MG TABS tablet Take 5 mg by mouth daily.  Marland Kitchen aspirin 81 MG tablet Take 81 mg by mouth daily.  Marland Kitchen atenolol (TENORMIN) 100 MG tablet Take 100 mg by mouth daily.  Marland Kitchen atorvastatin (LIPITOR) 40 MG tablet Take 40 mg by mouth daily.  Marland Kitchen diltiazem (TIAZAC) 300 MG 24 hr capsule Take 300 mg by mouth daily.  Marland Kitchen ezetimibe (ZETIA) 10 MG tablet Take 10 mg by mouth daily.  Marland Kitchen levothyroxine (SYNTHROID, LEVOTHROID) 50 MCG tablet Take 50 mcg by mouth daily before breakfast.  . Multiple Vitamins-Minerals (MULTIVITAMIN PO) Take by  mouth.  . Omega-3 Fatty Acids (FISH OIL) 1000 MG CAPS Take by mouth 2 (two) times daily.  . quinapril (ACCUPRIL) 40 MG tablet Take 40 mg by mouth at bedtime.  . valsartan-hydrochlorothiazide (DIOVAN-HCT) 160-12.5 MG tablet TK 1 T PO QD   No current facility-administered medications for this visit. (Other)      REVIEW OF SYSTEMS:    ALLERGIES No Known Allergies  PAST MEDICAL HISTORY Past Medical History:  Diagnosis Date  . Glaucoma   . Hyperlipidemia   . Hypertension    Past Surgical History:  Procedure Laterality Date  . ANGIOPLASTY    . APPENDECTOMY    . served nerve     hand 2014    FAMILY HISTORY Family History  Problem Relation Age of Onset  . Pneumonia Mother   . Asthma Mother   . Diverticulitis Mother   . Glaucoma Mother   . Congestive Heart Failure Father   . CVA Father   . Kidney failure Father   . Glaucoma Father   . Diverticulitis Father   . Colon cancer Neg Hx   . Pancreatic cancer Neg Hx   . Stomach cancer Neg Hx     SOCIAL HISTORY Social History   Tobacco Use  . Smoking status: Current Some Day Smoker  .  Smokeless tobacco: Never Used  . Tobacco comment: cigars  Substance Use Topics  . Alcohol use: Yes  . Drug use: No         OPHTHALMIC EXAM:  Base Eye Exam    Visual Acuity (ETDRS)      Right Left   Dist cc E card @ 4' 20/25 +2   Dist ph cc NI        Tonometry (Tonopen, 8:12 AM)      Right Left   Pressure 36 19       Pupils      Pupils Dark Light Shape React APD   Right PERRL 4 3 Round Brisk None   Left PERRL 4 3 Round Brisk None       Visual Fields (Counting fingers)      Left Right    Full Full       Extraocular Movement      Right Left    Full Full       Neuro/Psych    Oriented x3: Yes   Mood/Affect: Normal       Dilation    Right eye: 1.0% Mydriacyl, 2.5% Phenylephrine @ 8:15 AM        Slit Lamp and Fundus Exam    External Exam      Right Left   External Normal Normal       Slit Lamp Exam       Right Left   Lids/Lashes Normal    Conjunctiva/Sclera White and quiet    Cornea Clear    Anterior Chamber Deep and quiet    Iris No rubeosis seen    Lens 2+ Nuclear sclerosis    Anterior Vitreous Normal        Fundus Exam      Right Left   Posterior Vitreous Normal    Disc 2+ Optic disc atrophy, 2+ Pallor,, with collaterals now on the nerve, no NVD    C/D Ratio 0.8    Macula Retinal pigment epithelial mottling    Vessels Old central retinal vein occlusion of the right eye, no visible NVE    Periphery Good moderate PRP laser scatter pattern           IMAGING AND PROCEDURES  Imaging and Procedures for 12/18/19  OCT, Retina - OU - Both Eyes       Right Eye Quality was good. Scan locations included subfoveal. Central Foveal Thickness: 216. Progression has improved. Findings include abnormal foveal contour, outer retinal atrophy, central retinal atrophy, inner retinal atrophy.   Left Eye Quality was good. Scan locations included subfoveal. Central Foveal Thickness: 246. Progression has been stable.   Notes CME OD much improved on intravitreal Eylea.  Will Gabriela Eves OD today and extend interval of examination and therapy.       Intravitreal Injection, Pharmacologic Agent - OD - Right Eye       Time Out 12/18/2019. 9:14 AM. Confirmed correct patient, procedure, site, and patient consented.   Anesthesia Topical anesthesia was used. Anesthetic medications included Akten 3.5%.   Procedure Preparation included 10% betadine to eyelids, Tobramycin 0.3%. A 30 gauge needle was used.   Injection:  2 mg aflibercept [name] SOLN   NDC: [id number], Lot: [id number]   Route: Intravitreal, Site: Right Eye, Waste: 0 mg  Post-op Post injection exam found visual acuity of at least counting fingers. The patient tolerated the procedure well. There were no complications. The patient received written and verbal post procedure care education. Post injection  medications were not  given.        Aspiration of Aqueous, Diagnostic - OD - Right Eye       Lid speculum applied, 0.1 cc of aqueous was aspirated via 30-gauge needle after topical antibiotics applied.  Intraocular pressure was found to be soft.                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 The nature of central retinal vein occlusion was discussed with the patient including the division of types into nonischemic ischemic. The potential sequelae of ischemic central retinal vein occlusion, including macular edema, neovascularization, rubeosis iridis, and neovascular glaucoma, were discussed, and the need for frequent follow-up.  The nature of macular edema and central retinal vein occlusion was discussed. The following options were considered:  1.Observation for a period to look for spontaneous improvement, is no linger the primary therapy. One-third worsen, one-third stay unchanged, and one-third improves.  2. Anti-VEGF Therapy. ( Lucentis, Avastin or Eylea ) injected  in intravitreal fashion, initially monthly then tailored to clinical response.  3. Intravitreal steroid usage, Kenalog, or Ozurdex, usually a second line therapy or in combination with anti-Vegf therapy noted above.  4. Panretinal laser photocoagulation to cause regression of iris neovascularization, or treat retinal  non-perfusion.  5. Surgical Management may include vitrectomy with incisions of peripheral veins to trigger retino choroidal anastomosis formation. This topic presented and discussed at [facility].   Secondary glaucoma OD without evidence neovascularization, will restart brimonidine 3 times daily OD  Neovascular glaucoma, right eye, severe stage This condition worsened off brimonidine with normal intraocular pressure of 15 some 2 and half weeks previous.  Will resume  brimonidine topically OD 3 times daily      ICD-10-CM   1. 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 Secondary glaucoma due to combination mechanisms, right, severe stage  H40.51X3 Aspiration of Aqueous, Diagnostic - OD - Right Eye  3. Neovascular glaucoma, right eye, severe stage  H40.51X3     1.  Injection intravitreal Leah completed OD today.  CRV O is well controlled without no CME and early signs of collateralization of the nerve.  Will extend the interval of follow-up.  2.  Anterior chamber release of aqueous, AC tap was completed today to normalize the pressure.  3.  Patient is to resume brimonidine 3 times daily right eye to control the pressure.  Up in 2 weeks for IOP check no dilation  Ophthalmic Meds Ordered this visit:  Meds ordered this encounter  Medications  . aflibercept (EYLEA) SOLN 2 mg       Return in about 2 weeks (around 01/01/2020) for NO DILATE, IOP Check.  There are no Patient Instructions on file for this visit.   Explained the diagnoses, plan, and follow up with the patient and they expressed understanding.  Patient expressed understanding of the importance of proper follow up care.   Clent Demark Mahogony Gilchrest M.D. Diseases & Surgery of the Retina and Vitreous Retina & Diabetic [place] 12/18/19     Abbreviations: M myopia (nearsighted); A astigmatism; H hyperopia (farsighted); P presbyopia; Mrx spectacle prescription;  CTL contact lenses; OD right eye; OS left  eye; OU both eyes  XT exotropia; ET esotropia; PEK punctate epithelial keratitis; PEE punctate epithelial erosions; DES dry eye syndrome; MGD meibomian gland dysfunction; ATs artificial tears; PFAT's preservative free artificial tears; [place] nuclear sclerotic cataract; PSC posterior subcapsular cataract; ERM epi-retinal membrane; PVD posterior  vitreous detachment; RD retinal detachment; DM diabetes mellitus; DR diabetic retinopathy; NPDR non-proliferative diabetic retinopathy; PDR proliferative diabetic retinopathy; CSME clinically significant macular edema; DME diabetic macular edema; dbh dot blot hemorrhages; CWS cotton wool spot; POAG primary open angle glaucoma; C/D cup-to-disc ratio; HVF humphrey visual field; GVF goldmann visual field; OCT optical coherence tomography; IOP intraocular pressure; BRVO Branch retinal vein occlusion; CRVO central retinal vein occlusion; CRAO central retinal artery occlusion; BRAO branch retinal artery occlusion; RT retinal tear; SB scleral buckle; PPV pars plana vitrectomy; VH Vitreous hemorrhage; PRP panretinal laser photocoagulation; IVK intravitreal kenalog; VMT vitreomacular traction; MH Macular hole;  NVD neovascularization of the disc; NVE neovascularization elsewhere; AREDS age related eye disease study; ARMD age related macular degeneration; POAG primary open angle glaucoma; EBMD epithelial/anterior basement membrane dystrophy; ACIOL anterior chamber intraocular lens; IOL intraocular lens; PCIOL posterior chamber intraocular lens; Phaco/IOL phacoemulsification with intraocular lens placement; [place] photorefractive keratectomy; LASIK laser assisted in situ keratomileusis; HTN hypertension; DM diabetes mellitus; COPD chronic obstructive pulmonary disease

## 2020-01-01 ENCOUNTER — Encounter (INDEPENDENT_AMBULATORY_CARE_PROVIDER_SITE_OTHER): Payer: Self-pay | Admitting: Ophthalmology

## 2020-01-01 ENCOUNTER — Other Ambulatory Visit: Payer: Self-pay

## 2020-01-01 ENCOUNTER — Ambulatory Visit (INDEPENDENT_AMBULATORY_CARE_PROVIDER_SITE_OTHER): Payer: Medicare PPO | Admitting: Ophthalmology

## 2020-01-01 DIAGNOSIS — L233 Allergic contact dermatitis due to drugs in contact with skin: Secondary | ICD-10-CM

## 2020-01-01 DIAGNOSIS — H35351 Cystoid macular degeneration, right eye: Secondary | ICD-10-CM | POA: Diagnosis not present

## 2020-01-01 DIAGNOSIS — H3411 Central retinal artery occlusion, right eye: Secondary | ICD-10-CM

## 2020-01-01 DIAGNOSIS — H34811 Central retinal vein occlusion, right eye, with macular edema: Secondary | ICD-10-CM

## 2020-01-01 DIAGNOSIS — H348111 Central retinal vein occlusion, right eye, with retinal neovascularization: Secondary | ICD-10-CM | POA: Diagnosis not present

## 2020-01-01 DIAGNOSIS — H4051X3 Glaucoma secondary to other eye disorders, right eye, severe stage: Secondary | ICD-10-CM | POA: Diagnosis not present

## 2020-01-01 NOTE — Progress Notes (Signed)
01/01/2020     CHIEF COMPLAINT Patient presents for Retina Follow Up   HISTORY OF PRESENT ILLNESS: Ralph Harper is a 79 y.o. male who presents to the clinic today for:   HPI    Retina Follow Up    Patient presents with  Other (CME).  In both eyes.  Severity is moderate.  Duration of 2 weeks.  Since onset it is stable.  I, the attending physician,  performed the HPI with the patient and updated documentation appropriately.          Comments    2 Week IOP Check OU  Pt states vision has improved. Pt states the gtts has caused OD to be crusty. Pt has been using gtts as directed and needs a refill.       Last edited by Tilda Franco on 01/01/2020  8:43 AM. (History)      Referring physician: Prince Solian, MD [place],  [place] [address]  HISTORICAL INFORMATION:   Selected notes from the MEDICAL RECORD NUMBER       CURRENT MEDICATIONS: Current Outpatient Medications (Ophthalmic Drugs)  Medication Sig  . latanoprost (XALATAN) 0.005 % ophthalmic solution Place 1 drop into both eyes at bedtime.  Marland Kitchen TIMOPTIC 0.5 % ophthalmic solution Place 1 drop into the right eye 2 (two) times daily.   No current facility-administered medications for this visit. (Ophthalmic Drugs)   Current Outpatient Medications (Other)  Medication Sig  . allopurinol (ZYLOPRIM) 100 MG tablet   . allopurinol (ZYLOPRIM) 300 MG tablet   . apixaban (ELIQUIS) 5 MG TABS tablet Take 5 mg by mouth daily.  Marland Kitchen aspirin 81 MG tablet Take 81 mg by mouth daily.  Marland Kitchen atenolol (TENORMIN) 100 MG tablet Take 100 mg by mouth daily.  Marland Kitchen atorvastatin (LIPITOR) 40 MG tablet Take 40 mg by mouth daily.  Marland Kitchen diltiazem (TIAZAC) 300 MG 24 hr capsule Take 300 mg by mouth daily.  Marland Kitchen ezetimibe (ZETIA) 10 MG tablet Take 10 mg by mouth daily.  Marland Kitchen levothyroxine (SYNTHROID, LEVOTHROID) 50 MCG tablet Take 50 mcg by mouth daily before breakfast.  . Multiple Vitamins-Minerals (MULTIVITAMIN PO) Take by mouth.  . Omega-3 Fatty  Acids (FISH OIL) 1000 MG CAPS Take by mouth 2 (two) times daily.  . quinapril (ACCUPRIL) 40 MG tablet Take 40 mg by mouth at bedtime.  . valsartan-hydrochlorothiazide (DIOVAN-HCT) 160-12.5 MG tablet TK 1 T PO QD   No current facility-administered medications for this visit. (Other)      REVIEW OF SYSTEMS:    ALLERGIES Allergies  Allergen Reactions  . Brimonidine Itching, Dermatitis and Rash    PAST MEDICAL HISTORY Past Medical History:  Diagnosis Date  . Glaucoma   . Hyperlipidemia   . Hypertension    Past Surgical History:  Procedure Laterality Date  . ANGIOPLASTY    . APPENDECTOMY    . served nerve     hand 2014    FAMILY HISTORY Family History  Problem Relation Age of Onset  . Pneumonia Mother   . Asthma Mother   . Diverticulitis Mother   . Glaucoma Mother   . Congestive Heart Failure Father   . CVA Father   . Kidney failure Father   . Glaucoma Father   . Diverticulitis Father   . Colon cancer Neg Hx   . Pancreatic cancer Neg Hx   . Stomach cancer Neg Hx     SOCIAL HISTORY Social History   Tobacco Use  . Smoking status: Current Some  Day Smoker  . Smokeless tobacco: Never Used  . Tobacco comment: cigars  Substance Use Topics  . Alcohol use: Yes  . Drug use: No         OPHTHALMIC EXAM:  Base Eye Exam    Visual Acuity (Snellen - Linear)      Right Left   Dist [place] E Card @ 6' 20/200   Dist ph cc  20/50 +       Tonometry (Tonopen, 8:48 AM)      Right Left   Pressure 21 21       Pupils      Pupils Dark Light Shape React APD   Right PERRL 4 3 Round Brisk None   Left PERRL 4 3 Round Brisk None       Neuro/Psych    Oriented x3: Yes   Mood/Affect: Normal        Slit Lamp and Fundus Exam    External Exam      Right Left   External Edema Normal       Slit Lamp Exam      Right Left   Lids/Lashes Blepharitis, Collarettes, Ecchymosis,, periocular erythema 360 around the lid with induration of the skin characteristic of contact  dermatitis.    Conjunctiva/Sclera White and quiet    Cornea Clear    Anterior Chamber Deep and quiet    Iris No rubeosis seen    Lens 2+ Nuclear sclerosis    Anterior Vitreous Normal           IMAGING AND PROCEDURES  Imaging and Procedures for 01/01/20           ASSESSMENT/PLAN:  Periocular contact dermatitis of the right eye characteristic most commonly seen with the use of topical brimonidine.  I explained to the patient that 15 to 20% of folks using this medication will develop allergy.  This medication, the purple top, must be stopped today and never used again in this patient    ICD-10-CM   1. 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 Secondary glaucoma due to combination mechanisms, right, severe stage  H40.51X3   3. Neovascular glaucoma, right eye, severe stage  H40.51X3   4. Cystoid macular edema of right eye  H35.351   5. 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 Central retinal artery occlusion of right eye  H34.11     1.  Patient will need to follow-up glaucoma topical therapy management with Dr. Craig Guess or his colleagues at [facility]. Will call to set up follow-up for patient.  2.  Patient will cease the use of brimonidine immediately.   3.  We will call [facility] today.  4.  Patient is scheduled in June for repeat dilated examination OD and consideration of intravitreal Eylea   Ophthalmic Meds Ordered this visit:  No orders of the defined types were placed in this encounter.      No follow-ups on file.  There are no Patient Instructions on file for this visit.   Explained the diagnoses, plan, and follow up with the patient and they expressed understanding.  Patient expressed understanding of the importance of proper follow up care.   Clent Demark  Ruby Logiudice M.D. Diseases & Surgery of the Retina and Vitreous Retina & Diabetic [place] 01/01/20  Abbreviations: M myopia (nearsighted); A astigmatism; H hyperopia (farsighted); P presbyopia; Mrx spectacle prescription;  CTL contact lenses; OD right eye; OS left eye; OU both eyes  XT exotropia; ET esotropia; PEK punctate epithelial keratitis; PEE punctate epithelial erosions; DES dry eye syndrome; MGD meibomian gland dysfunction; ATs artificial tears; PFAT's preservative free artificial tears; [place] nuclear sclerotic cataract; PSC posterior subcapsular cataract; ERM epi-retinal membrane; PVD posterior vitreous detachment; RD retinal detachment; DM diabetes mellitus; DR diabetic retinopathy; NPDR non-proliferative diabetic retinopathy; PDR proliferative diabetic retinopathy; CSME clinically significant macular edema; DME diabetic macular edema; dbh dot blot hemorrhages; CWS cotton wool spot; POAG primary open angle glaucoma; C/D cup-to-disc ratio; HVF humphrey visual field; GVF goldmann visual field; OCT optical coherence tomography; IOP intraocular pressure; BRVO Branch retinal vein occlusion; CRVO central retinal vein occlusion; CRAO central retinal artery occlusion; BRAO branch retinal artery occlusion; RT retinal tear; SB scleral buckle; PPV pars plana vitrectomy; VH Vitreous hemorrhage; PRP panretinal laser photocoagulation; IVK intravitreal kenalog; VMT vitreomacular traction; MH Macular hole;  NVD neovascularization of the disc; NVE neovascularization elsewhere; AREDS age related eye disease study; ARMD age related macular degeneration; POAG primary open angle glaucoma; EBMD epithelial/anterior basement membrane dystrophy; ACIOL anterior chamber intraocular lens; IOL intraocular lens; PCIOL posterior chamber intraocular lens; Phaco/IOL phacoemulsification with intraocular lens placement; [place] photorefractive keratectomy; LASIK laser assisted in situ keratomileusis; HTN hypertension; DM diabetes  mellitus; COPD chronic obstructive pulmonary disease

## 2020-02-05 ENCOUNTER — Other Ambulatory Visit: Payer: Self-pay

## 2020-02-05 ENCOUNTER — Ambulatory Visit (INDEPENDENT_AMBULATORY_CARE_PROVIDER_SITE_OTHER): Payer: Medicare PPO | Admitting: Ophthalmology

## 2020-02-05 ENCOUNTER — Encounter (INDEPENDENT_AMBULATORY_CARE_PROVIDER_SITE_OTHER): Payer: Self-pay | Admitting: Ophthalmology

## 2020-02-05 DIAGNOSIS — H2513 Age-related nuclear cataract, bilateral: Secondary | ICD-10-CM | POA: Diagnosis not present

## 2020-02-05 DIAGNOSIS — H34811 Central retinal vein occlusion, right eye, with macular edema: Secondary | ICD-10-CM

## 2020-02-05 MED ORDER — TIMOPTIC 0.5 % OP SOLN: 1.0000 [drp] | Freq: Two times a day (BID) | OPHTHALMIC | 6 refills | Status: DC

## 2020-02-05 MED ORDER — AFLIBERCEPT 2MG/0.05ML IZ SOLN FOR KALEIDOSCOPE
2.0000 mg | INTRAVITREAL | Status: AC | PRN
  Administered 2020-02-05: 2 mg via INTRAVITREAL

## 2020-02-05 NOTE — Assessment & Plan Note (Signed)
7-week follow-up, CME improved and resolved from [place].  We will repeat injection today and examination repeated in 9 weeks.  Subfoveal scarring accounts for the acuity.

## 2020-02-05 NOTE — Progress Notes (Signed)
02/05/2020     CHIEF COMPLAINT Patient presents for Retina Follow Up   HISTORY OF PRESENT ILLNESS: Ralph Harper is a 79 y.o. male who presents to the clinic today for:   HPI    Retina Follow Up    Patient presents with  CRVO/BRVO.  In right eye.  Duration of 7 weeks.  Since onset it is stable.          Comments    7 week follow up - OCT OU, Poss Eylea OD Patient states his vision has improved since last visit., No complaints.  Needs refill on Timolol.       Last edited by Gerda Diss on 02/05/2020  8:37 AM. (History)      Referring physician: Prince Solian, MD [place],  [place] [address]  HISTORICAL INFORMATION:   Selected notes from the MEDICAL RECORD NUMBER       CURRENT MEDICATIONS: Current Outpatient Medications (Ophthalmic Drugs)  Medication Sig  . latanoprost (XALATAN) 0.005 % ophthalmic solution Place 1 drop into both eyes at bedtime.  Marland Kitchen TIMOPTIC 0.5 % ophthalmic solution Place 1 drop into the right eye 2 (two) times daily.   No current facility-administered medications for this visit. (Ophthalmic Drugs)   Current Outpatient Medications (Other)  Medication Sig  . allopurinol (ZYLOPRIM) 100 MG tablet   . allopurinol (ZYLOPRIM) 300 MG tablet   . apixaban (ELIQUIS) 5 MG TABS tablet Take 5 mg by mouth daily.  Marland Kitchen aspirin 81 MG tablet Take 81 mg by mouth daily.  Marland Kitchen atenolol (TENORMIN) 100 MG tablet Take 100 mg by mouth daily.  Marland Kitchen atorvastatin (LIPITOR) 40 MG tablet Take 40 mg by mouth daily.  Marland Kitchen diltiazem (TIAZAC) 300 MG 24 hr capsule Take 300 mg by mouth daily.  Marland Kitchen ezetimibe (ZETIA) 10 MG tablet Take 10 mg by mouth daily.  Marland Kitchen levothyroxine (SYNTHROID, LEVOTHROID) 50 MCG tablet Take 50 mcg by mouth daily before breakfast.  . Multiple Vitamins-Minerals (MULTIVITAMIN PO) Take by mouth.  . Omega-3 Fatty Acids (FISH OIL) 1000 MG CAPS Take by mouth 2 (two) times daily.  . quinapril (ACCUPRIL) 40 MG tablet Take 40 mg by mouth at bedtime.  .  valsartan-hydrochlorothiazide (DIOVAN-HCT) 160-12.5 MG tablet TK 1 T PO QD   No current facility-administered medications for this visit. (Other)      REVIEW OF SYSTEMS:    ALLERGIES Allergies  Allergen Reactions  . Brimonidine Itching, Dermatitis and Rash    PAST MEDICAL HISTORY Past Medical History:  Diagnosis Date  . Glaucoma   . Hyperlipidemia   . Hypertension    Past Surgical History:  Procedure Laterality Date  . ANGIOPLASTY    . APPENDECTOMY    . served nerve     hand 2014    FAMILY HISTORY Family History  Problem Relation Age of Onset  . Pneumonia Mother   . Asthma Mother   . Diverticulitis Mother   . Glaucoma Mother   . Congestive Heart Failure Father   . CVA Father   . Kidney failure Father   . Glaucoma Father   . Diverticulitis Father   . Colon cancer Neg Hx   . Pancreatic cancer Neg Hx   . Stomach cancer Neg Hx     SOCIAL HISTORY Social History   Tobacco Use  . Smoking status: Current Some Day Smoker  . Smokeless tobacco: Never Used  . Tobacco comment: cigars  Substance Use Topics  . Alcohol use: Yes  . Drug use: No  OPHTHALMIC EXAM:  Base Eye Exam    Visual Acuity (Snellen - Linear)      Right Left   Dist cc CF @ 7' 20/50+2   Dist ph cc NI 20/40+1       Tonometry (Tonopen, 8:43 AM)      Right Left   Pressure 20 15       Pupils      Pupils Dark Light Shape React APD   Right PERRL 4 3 Round Brisk None   Left PERRL 4 3 Round Brisk None       Visual Fields (Counting fingers)      Left Right    Full Full       Extraocular Movement      Right Left    Full Full       Neuro/Psych    Oriented x3: Yes   Mood/Affect: Normal       Dilation    Both eyes: 1.0% Mydriacyl, 2.5% Phenylephrine @ 8:43 AM        Slit Lamp and Fundus Exam    External Exam      Right Left   External Normal Normal       Slit Lamp Exam      Right Left   Lids/Lashes Normal Normal   Conjunctiva/Sclera White and quiet White and  quiet   Cornea Clear Clear   Anterior Chamber Deep and quiet Deep and quiet   Iris Round and reactive Round and reactive   Lens 3+ Nuclear sclerosis, 1+ Posterior subcapsular cataract 2+ Nuclear sclerosis   Anterior Vitreous Normal Normal       Fundus Exam      Right Left   Posterior Vitreous Posterior vitreous detachment    Disc 2+ Optic disc atrophy, 2+ Pallor,, with collaterals now on the nerve, no NVD    C/D Ratio 0.8    Macula Retinal pigment epithelial mottling    Vessels 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 Scan locations included subfoveal. Central Foveal Thickness: 209. Progression has improved. Findings include subretinal scarring, no IRF, no SRF, central retinal atrophy.   Left Eye Quality was good. Scan locations included subfoveal. Central Foveal Thickness: 250. Progression has been stable. Findings include retinal drusen .   Notes OD, resolved central vein occlusion, resolved CME currently on 7-week interval eye on Eylea.  Will repeat Eylea today and examination in 9 weeks       Intravitreal Injection, Pharmacologic Agent - OD - Right Eye       Time Out 02/05/2020. 9:20 AM. Confirmed correct patient, procedure, site, and patient consented.   Anesthesia Topical anesthesia was used. Anesthetic medications included Akten 3.5%.   Procedure Preparation included 10% betadine to eyelids, Tobramycin 0.3%.   Injection:  2 mg aflibercept [name] SOLN   NDC: A3590391, Lot: [id number]   Route: Intravitreal, Site: Right Eye, Waste: 0 mg  Post-op Post injection exam found visual acuity of at least counting fingers. The patient tolerated the procedure well. There were no complications. The patient received written and verbal post procedure  care education. Post injection medications were not given.  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  We will repeat injection today and examination repeated in 9 weeks.  Subfoveal scarring accounts for the acuity.  Nuclear sclerotic cataract of both eyes OU, with visually significant cataract.  Explained to the patient that surgery in the right eye while not improving acuity will involve continued and enhanced medical monitoring the central retinal vascular abnormalities      ICD-10-CM   1. 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 Nuclear sclerotic cataract of both eyes  H25.13     1.  2.  3.  Ophthalmic Meds Ordered this visit:  Meds ordered this encounter  Medications  . aflibercept (EYLEA) SOLN 2 mg       Return in about 9 weeks (around 04/08/2020) for dilate, OD, EYLEA OCT.  There are no Patient Instructions on file for this visit.   Explained the diagnoses, plan, and follow up with the patient and they expressed understanding.  Patient expressed understanding of the importance of proper follow up care.   Clent Demark Dafna Romo M.D. Diseases & Surgery of the Retina and Vitreous Retina & Diabetic [place] 02/05/20     Abbreviations: M myopia (nearsighted); A astigmatism; H hyperopia (farsighted); P presbyopia; Mrx spectacle prescription;  CTL contact lenses; OD right eye; OS left eye; OU both eyes  XT exotropia; ET esotropia; PEK punctate epithelial keratitis; PEE punctate epithelial erosions; DES dry eye syndrome; MGD meibomian gland dysfunction; ATs artificial tears; PFAT's preservative free artificial tears; [place] nuclear sclerotic cataract; PSC posterior subcapsular cataract; ERM epi-retinal  membrane; PVD posterior vitreous detachment; RD retinal detachment; DM diabetes mellitus; DR diabetic retinopathy; NPDR non-proliferative diabetic retinopathy; PDR proliferative diabetic retinopathy; CSME clinically significant macular edema; DME diabetic macular edema; dbh dot blot hemorrhages; CWS cotton wool spot; POAG primary open angle glaucoma; C/D cup-to-disc ratio; HVF humphrey visual field; GVF goldmann visual field; OCT optical coherence tomography; IOP intraocular pressure; BRVO Branch retinal vein occlusion; CRVO central retinal vein occlusion; CRAO central retinal artery occlusion; BRAO branch retinal artery occlusion; RT retinal tear; SB scleral buckle; PPV pars plana vitrectomy; VH Vitreous hemorrhage; PRP panretinal laser photocoagulation; IVK intravitreal kenalog; VMT vitreomacular traction; MH Macular hole;  NVD neovascularization of the disc; NVE neovascularization elsewhere; AREDS age related eye disease study; ARMD age related macular degeneration; POAG primary open angle glaucoma; EBMD epithelial/anterior basement membrane dystrophy; ACIOL anterior chamber intraocular lens; IOL intraocular lens; PCIOL posterior chamber intraocular lens; Phaco/IOL phacoemulsification with intraocular lens placement; [place] photorefractive keratectomy; LASIK laser assisted in situ keratomileusis; HTN hypertension; DM diabetes mellitus; COPD chronic obstructive pulmonary disease

## 2020-02-05 NOTE — Assessment & Plan Note (Signed)
OU, with visually significant cataract.  Explained to the patient that surgery in the right eye while not improving acuity will involve continued and enhanced medical monitoring the central retinal vascular abnormalities

## 2020-04-08 ENCOUNTER — Encounter (INDEPENDENT_AMBULATORY_CARE_PROVIDER_SITE_OTHER): Payer: Medicare PPO | Admitting: Ophthalmology

## 2020-04-16 ENCOUNTER — Encounter (INDEPENDENT_AMBULATORY_CARE_PROVIDER_SITE_OTHER): Payer: Self-pay | Admitting: Ophthalmology

## 2020-04-16 ENCOUNTER — Other Ambulatory Visit: Payer: Self-pay

## 2020-04-16 ENCOUNTER — Ambulatory Visit (INDEPENDENT_AMBULATORY_CARE_PROVIDER_SITE_OTHER): Payer: Medicare PPO | Admitting: Ophthalmology

## 2020-04-16 DIAGNOSIS — H34811 Central retinal vein occlusion, right eye, with macular edema: Secondary | ICD-10-CM | POA: Diagnosis not present

## 2020-04-16 DIAGNOSIS — H4051X3 Glaucoma secondary to other eye disorders, right eye, severe stage: Secondary | ICD-10-CM | POA: Diagnosis not present

## 2020-04-16 DIAGNOSIS — H2513 Age-related nuclear cataract, bilateral: Secondary | ICD-10-CM | POA: Diagnosis not present

## 2020-04-16 MED ORDER — AFLIBERCEPT 2MG/0.05ML IZ SOLN FOR KALEIDOSCOPE
2.0000 mg | INTRAVITREAL | Status: AC | PRN
  Administered 2020-04-16: 2 mg via INTRAVITREAL

## 2020-04-16 NOTE — Progress Notes (Signed)
04/16/2020     CHIEF COMPLAINT Patient presents for Retina Follow Up   HISTORY OF PRESENT ILLNESS: Ralph Harper is a 79 y.o. male who presents to the clinic today for:   HPI    Retina Follow Up    Patient presents with  CRVO/BRVO.  In right eye.  Severity is moderate.  Duration of 10 weeks.  Since onset it is stable.  I, the attending physician,  performed the HPI with the patient and updated documentation appropriately.          Comments    10 Week CRVO f\u OD. Possible Eylea OD. OCT  Pt states vision is slightly better. Pt states he is supposed to see Dr. Delman Cheadle today for glaucoma workup.        Last edited by Tilda Franco on 04/16/2020  8:13 AM. (History)      Referring physician: Prince Solian, MD [place],  [place] [address]  HISTORICAL INFORMATION:   Selected notes from the MEDICAL RECORD NUMBER       CURRENT MEDICATIONS: Current Outpatient Medications (Ophthalmic Drugs)  Medication Sig  . latanoprost (XALATAN) 0.005 % ophthalmic solution Place 1 drop into both eyes at bedtime.  Marland Kitchen TIMOPTIC 0.5 % ophthalmic solution Place 1 drop into the right eye 2 (two) times daily.   No current facility-administered medications for this visit. (Ophthalmic Drugs)   Current Outpatient Medications (Other)  Medication Sig  . allopurinol (ZYLOPRIM) 100 MG tablet   . allopurinol (ZYLOPRIM) 300 MG tablet   . apixaban (ELIQUIS) 5 MG TABS tablet Take 5 mg by mouth daily.  Marland Kitchen aspirin 81 MG tablet Take 81 mg by mouth daily.  Marland Kitchen atenolol (TENORMIN) 100 MG tablet Take 100 mg by mouth daily.  Marland Kitchen atorvastatin (LIPITOR) 40 MG tablet Take 40 mg by mouth daily.  Marland Kitchen diltiazem (TIAZAC) 300 MG 24 hr capsule Take 300 mg by mouth daily.  Marland Kitchen ezetimibe (ZETIA) 10 MG tablet Take 10 mg by mouth daily.  Marland Kitchen levothyroxine (SYNTHROID, LEVOTHROID) 50 MCG tablet Take 50 mcg by mouth daily before breakfast.  . Multiple Vitamins-Minerals (MULTIVITAMIN PO) Take by mouth.  . Omega-3 Fatty Acids  (FISH OIL) 1000 MG CAPS Take by mouth 2 (two) times daily.  . quinapril (ACCUPRIL) 40 MG tablet Take 40 mg by mouth at bedtime.  . valsartan-hydrochlorothiazide (DIOVAN-HCT) 160-12.5 MG tablet TK 1 T PO QD   No current facility-administered medications for this visit. (Other)      REVIEW OF SYSTEMS:    ALLERGIES Allergies  Allergen Reactions  . Brimonidine Itching, Dermatitis and Rash    PAST MEDICAL HISTORY Past Medical History:  Diagnosis Date  . Glaucoma   . Hyperlipidemia   . Hypertension    Past Surgical History:  Procedure Laterality Date  . ANGIOPLASTY    . APPENDECTOMY    . served nerve     hand 2014    FAMILY HISTORY Family History  Problem Relation Age of Onset  . Pneumonia Mother   . Asthma Mother   . Diverticulitis Mother   . Glaucoma Mother   . Congestive Heart Failure Father   . CVA Father   . Kidney failure Father   . Glaucoma Father   . Diverticulitis Father   . Colon cancer Neg Hx   . Pancreatic cancer Neg Hx   . Stomach cancer Neg Hx     SOCIAL HISTORY Social History   Tobacco Use  . Smoking status: Current Some Day Smoker  .  Smokeless tobacco: Never Used  . Tobacco comment: cigars  Substance Use Topics  . Alcohol use: Yes  . Drug use: No         OPHTHALMIC EXAM:  Base Eye Exam    Visual Acuity (Snellen - Linear)      Right Left   Dist [place] CF @ 7' 20/50   Dist ph [place] NI        Tonometry (Tonopen, 8:18 AM)      Right Left   Pressure 30 19       Tonometry #2 (Tonopen, 8:47 AM)      Right Left   Pressure 22        Pupils      Pupils Dark Light Shape React APD   Right PERRL 4 4 Round Sluggish None   Left PERRL 4 4 Round Sluggish None       Visual Fields (Counting fingers)      Left Right    Full Full       Neuro/Psych    Oriented x3: Yes   Mood/Affect: Normal       Dilation    Right eye: 1.0% Mydriacyl, 2.5% Phenylephrine @ 8:26 AM        Slit Lamp and Fundus Exam    External Exam      Right Left     External Normal Normal       Slit Lamp Exam      Right Left   Lids/Lashes Normal Normal   Conjunctiva/Sclera White and quiet White and quiet   Cornea Clear Clear   Anterior Chamber Deep and quiet Deep and quiet   Iris Round and reactive Round and reactive   Lens 3+ Nuclear sclerosis, 1+ Posterior subcapsular cataract 2+ Nuclear sclerosis   Anterior Vitreous Normal Normal       Fundus Exam      Right Left   Posterior Vitreous Posterior vitreous detachment    Disc 2+ Optic disc atrophy, 2+ Pallor,, with collaterals now on the nerve, no NVD    C/D Ratio 0.8    Macula Retinal pigment epithelial mottling, no macular thickening    Vessels Old central retinal vein occlusion of the right eye, no visible NVE    Periphery Good moderate PRP laser scatter pattern           IMAGING AND PROCEDURES  Imaging and Procedures for 04/16/20  OCT, Retina - OU - Both Eyes       Right Eye Quality was good. Scan locations included subfoveal. Central Foveal Thickness: 201. Progression has improved. Findings include abnormal foveal contour, outer retinal atrophy, central retinal atrophy.   Left Eye Scan locations included subfoveal. Central Foveal Thickness: 240. Progression has been stable. Findings include normal observations.        Intravitreal Injection, Pharmacologic Agent - OD - Right Eye       Time Out 04/16/2020. 8:46 AM. Confirmed correct patient, procedure, site, and patient consented.   Anesthesia Topical anesthesia was used. Anesthetic medications included Akten 3.5%.   Procedure Preparation included 10% betadine to eyelids, Tobramycin 0.3%.   Injection:  2 mg aflibercept [name] SOLN   NDC: A3590391, Lot: [id number]   Route: Intravitreal, Site: Right Eye, Waste: 0 mg  Post-op Post injection exam found visual acuity of at least counting fingers. The patient tolerated the procedure well. There were no complications. The patient received written and verbal post  procedure care education. Post injection medications were not given.  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  Currently at 9-week follow-up exam  Intravitreal Eylea OD today and examination in 9 to 10 weeks.  Nuclear sclerotic cataract of both eyes Up with Dr. 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 Nuclear sclerotic cataract of both eyes  H25.13   3. Neovascular glaucoma, right eye, severe stage  H40.51X3     1.  Repeat intravitreal Eylea OD today to control CME currently at 9-week interval, repeat examination in 9-10 weeks OD  2.  I RecCommend follow-up with Dr. Delman Cheadle this week for evaluation of intraocular pressure which continues to be borderline.  3.  Recommend consideration of cataract surgery in the right eye for medical reasons to assist in controlling intraocular pressure and continued medical monitoring of his retinal condition OD  Ophthalmic Meds Ordered this visit:  Meds ordered this encounter  Medications  . aflibercept (EYLEA) SOLN 2 mg       Return in about 9 weeks (around 06/18/2020) for dilate, OD, EYLEA OCT.  There are no Patient Instructions on file for this visit.   Explained the diagnoses, plan, and follow up with the patient and they expressed understanding.  Patient expressed  understanding of the importance of proper follow up care.   Clent Demark Isack Lavalley M.D. Diseases & Surgery of the Retina and Vitreous Retina & Diabetic [place] 04/16/20     Abbreviations: M myopia (nearsighted); A astigmatism; H hyperopia (farsighted); P presbyopia; Mrx spectacle prescription;  CTL contact lenses; OD right eye; OS left eye; OU both eyes  XT exotropia; ET esotropia; PEK punctate epithelial keratitis; PEE punctate epithelial erosions; DES dry eye syndrome; MGD meibomian gland dysfunction; ATs artificial tears; PFAT's preservative free artificial tears; [place] nuclear sclerotic cataract; PSC posterior subcapsular cataract; ERM epi-retinal membrane; PVD posterior vitreous detachment; RD retinal detachment; DM diabetes mellitus; DR diabetic retinopathy; NPDR non-proliferative diabetic retinopathy; PDR proliferative diabetic retinopathy; CSME clinically significant macular edema; DME diabetic macular edema; dbh dot blot hemorrhages; CWS cotton wool spot; POAG primary open angle glaucoma; C/D cup-to-disc ratio; HVF humphrey visual field; GVF goldmann visual field; OCT optical coherence tomography; IOP intraocular pressure; BRVO Branch retinal vein occlusion; CRVO central retinal vein occlusion; CRAO central retinal artery occlusion; BRAO branch retinal artery occlusion; RT retinal tear; SB scleral buckle; PPV pars plana vitrectomy; VH Vitreous hemorrhage; PRP panretinal laser photocoagulation; IVK intravitreal kenalog; VMT vitreomacular traction; MH Macular hole;  NVD neovascularization of the disc; NVE neovascularization elsewhere; AREDS age related eye disease study; ARMD age related macular degeneration; POAG primary open angle glaucoma; EBMD epithelial/anterior basement membrane dystrophy; ACIOL anterior chamber intraocular lens; IOL intraocular lens; PCIOL posterior chamber intraocular lens; Phaco/IOL phacoemulsification with intraocular lens placement; [place] photorefractive keratectomy; LASIK laser  assisted in situ keratomileusis; HTN hypertension; DM diabetes mellitus; COPD chronic obstructive pulmonary disease

## 2020-04-16 NOTE — Assessment & Plan Note (Signed)
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

## 2020-04-16 NOTE — Assessment & Plan Note (Signed)
Much less CME OD from CRV O, foveal atrophy remains accounting for acuity.  Currently at 9-week follow-up exam  Intravitreal Eylea OD today and examination in 9 to 10 weeks.

## 2020-04-16 NOTE — Assessment & Plan Note (Signed)
Up with Dr. Delman Cheadle this week and can talk discussion about cataract surgery

## 2020-05-14 DIAGNOSIS — H2513 Age-related nuclear cataract, bilateral: Secondary | ICD-10-CM | POA: Diagnosis not present

## 2020-05-14 DIAGNOSIS — H401132 Primary open-angle glaucoma, bilateral, moderate stage: Secondary | ICD-10-CM | POA: Diagnosis not present

## 2020-05-14 DIAGNOSIS — H349 Unspecified retinal vascular occlusion: Secondary | ICD-10-CM | POA: Diagnosis not present

## 2020-06-08 DIAGNOSIS — Z23 Encounter for immunization: Secondary | ICD-10-CM | POA: Diagnosis not present

## 2020-06-10 DIAGNOSIS — Z125 Encounter for screening for malignant neoplasm of prostate: Secondary | ICD-10-CM | POA: Diagnosis not present

## 2020-06-10 DIAGNOSIS — E785 Hyperlipidemia, unspecified: Secondary | ICD-10-CM | POA: Diagnosis not present

## 2020-06-10 DIAGNOSIS — E039 Hypothyroidism, unspecified: Secondary | ICD-10-CM | POA: Diagnosis not present

## 2020-06-14 DIAGNOSIS — R82998 Other abnormal findings in urine: Secondary | ICD-10-CM | POA: Diagnosis not present

## 2020-06-14 DIAGNOSIS — I10 Essential (primary) hypertension: Secondary | ICD-10-CM | POA: Diagnosis not present

## 2020-06-17 DIAGNOSIS — D751 Secondary polycythemia: Secondary | ICD-10-CM | POA: Diagnosis not present

## 2020-06-17 DIAGNOSIS — D692 Other nonthrombocytopenic purpura: Secondary | ICD-10-CM | POA: Diagnosis not present

## 2020-06-17 DIAGNOSIS — F17200 Nicotine dependence, unspecified, uncomplicated: Secondary | ICD-10-CM | POA: Diagnosis not present

## 2020-06-17 DIAGNOSIS — Z0000 Encounter for general adult medical examination without abnormal findings: Secondary | ICD-10-CM | POA: Diagnosis not present

## 2020-06-17 DIAGNOSIS — I10 Essential (primary) hypertension: Secondary | ICD-10-CM | POA: Diagnosis not present

## 2020-06-17 DIAGNOSIS — Z7901 Long term (current) use of anticoagulants: Secondary | ICD-10-CM | POA: Diagnosis not present

## 2020-06-17 DIAGNOSIS — E785 Hyperlipidemia, unspecified: Secondary | ICD-10-CM | POA: Diagnosis not present

## 2020-06-17 DIAGNOSIS — E039 Hypothyroidism, unspecified: Secondary | ICD-10-CM | POA: Diagnosis not present

## 2020-06-18 ENCOUNTER — Encounter (INDEPENDENT_AMBULATORY_CARE_PROVIDER_SITE_OTHER): Payer: Self-pay | Admitting: Ophthalmology

## 2020-06-18 ENCOUNTER — Ambulatory Visit (INDEPENDENT_AMBULATORY_CARE_PROVIDER_SITE_OTHER): Payer: Medicare PPO | Admitting: Ophthalmology

## 2020-06-18 ENCOUNTER — Other Ambulatory Visit: Payer: Self-pay

## 2020-06-18 DIAGNOSIS — H2513 Age-related nuclear cataract, bilateral: Secondary | ICD-10-CM | POA: Diagnosis not present

## 2020-06-18 DIAGNOSIS — H3411 Central retinal artery occlusion, right eye: Secondary | ICD-10-CM | POA: Diagnosis not present

## 2020-06-18 DIAGNOSIS — H34811 Central retinal vein occlusion, right eye, with macular edema: Secondary | ICD-10-CM | POA: Diagnosis not present

## 2020-06-18 MED ORDER — AFLIBERCEPT 2MG/0.05ML IZ SOLN FOR KALEIDOSCOPE
2.0000 mg | INTRAVITREAL | Status: AC | PRN
  Administered 2020-06-18: 2 mg via INTRAVITREAL

## 2020-06-18 NOTE — Assessment & Plan Note (Addendum)
The nature of central retinal vein occlusion was discussed with the patient including the division of types into nonischemic ischemic. The potential sequelae of ischemic central retinal vein occlusion, including macular edema, neovascularization, rubeosis iridis, and neovascular glaucoma, were discussed, and the need for frequent follow-up.  The nature of macular edema and central retinal vein occlusion was discussed. The following options were considered:  1.Observation for a period to look for spontaneous improvement, is no linger the primary therapy. One-third worsen, one-third stay unchanged, and one-third improves.  2. Anti-VEGF Therapy. ( Lucentis, Avastin or Eylea ) injected  in intravitreal fashion, initially monthly then tailored to clinical response.  3. Intravitreal steroid usage, Kenalog, or Ozurdex, usually a second line therapy or in combination with anti-Vegf therapy noted above.  4. Panretinal laser photocoagulation to cause regression of iris neovascularization, or treat retinal  non-perfusion.  5. Surgical Management may include vitrectomy with incisions of peripheral veins to trigger retino choroidal anastomosis formation. This topic presented and discussed at [facility].   OD, vastly improved CME from onset from [place] [facility].  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  Nonetheless vastly improved CME, less neovascularization, on intravitreal Eylea today at 9-week follow-up will repeat injection today and examination in 3 months  Lateralization of the nerve suggest that this may be now a compensated CRV O

## 2020-06-18 NOTE — Assessment & Plan Note (Signed)
OD, and OS each with cataract and OS with cataract which could explain his current visual functioning in the left eye.  As a precursor to cataract surgery in the left eye, his best sighted eye, I would urge consideration of cataract extraction with intraocular lens placement in the right eye to remove the obstruction to medical monitoring of his retinal condition in the right eye, maximize visual potential, as well as prepare him psychologically for cataract surgery in the left eye should he choose to improve his acuity

## 2020-06-18 NOTE — Assessment & Plan Note (Signed)
Likely some aspect of this accounts for the diffuse retinal atrophy, no active disease now

## 2020-06-18 NOTE — Progress Notes (Signed)
06/18/2020     CHIEF COMPLAINT Patient presents for Retina Follow Up   HISTORY OF PRESENT ILLNESS: Ralph Harper is a 79 y.o. male who presents to the clinic today for:   HPI    Retina Follow Up    Patient presents with  CRVO/BRVO.  In right eye.  This started 9 weeks ago.  Severity is mild.  Duration of 9 weeks.  Since onset it is stable.          Comments    9 Week F/U OD, poss Eylea OD  Pt denies noticeable changes to [place] OU since last visit. Pt denies ocular pain, flashes of light, or floaters OU.         Last edited by Rockie Neighbours, [place] on 06/18/2020  8:47 AM. (History)      Referring physician: Prince Solian, MD [place],  [place] [address]  HISTORICAL INFORMATION:   Selected notes from the MEDICAL RECORD NUMBER       CURRENT MEDICATIONS: Current Outpatient Medications (Ophthalmic Drugs)  Medication Sig  . latanoprost (XALATAN) 0.005 % ophthalmic solution Place 1 drop into both eyes at bedtime.  Marland Kitchen TIMOPTIC 0.5 % ophthalmic solution Place 1 drop into the right eye 2 (two) times daily.   No current facility-administered medications for this visit. (Ophthalmic Drugs)   Current Outpatient Medications (Other)  Medication Sig  . allopurinol (ZYLOPRIM) 100 MG tablet   . allopurinol (ZYLOPRIM) 300 MG tablet   . apixaban (ELIQUIS) 5 MG TABS tablet Take 5 mg by mouth daily.  Marland Kitchen aspirin 81 MG tablet Take 81 mg by mouth daily.  Marland Kitchen atenolol (TENORMIN) 100 MG tablet Take 100 mg by mouth daily.  Marland Kitchen atorvastatin (LIPITOR) 40 MG tablet Take 40 mg by mouth daily.  Marland Kitchen diltiazem (TIAZAC) 300 MG 24 hr capsule Take 300 mg by mouth daily.  Marland Kitchen ezetimibe (ZETIA) 10 MG tablet Take 10 mg by mouth daily.  Marland Kitchen levothyroxine (SYNTHROID, LEVOTHROID) 50 MCG tablet Take 50 mcg by mouth daily before breakfast.  . Multiple Vitamins-Minerals (MULTIVITAMIN PO) Take by mouth.  . Omega-3 Fatty Acids (FISH OIL) 1000 MG CAPS Take by mouth 2 (two) times daily.  . quinapril (ACCUPRIL)  40 MG tablet Take 40 mg by mouth at bedtime.  . valsartan-hydrochlorothiazide (DIOVAN-HCT) 160-12.5 MG tablet TK 1 T PO QD   No current facility-administered medications for this visit. (Other)      REVIEW OF SYSTEMS:    ALLERGIES Allergies  Allergen Reactions  . Brimonidine Itching, Dermatitis and Rash    PAST MEDICAL HISTORY Past Medical History:  Diagnosis Date  . Glaucoma   . Hyperlipidemia   . Hypertension    Past Surgical History:  Procedure Laterality Date  . ANGIOPLASTY    . APPENDECTOMY    . served nerve     hand 2014    FAMILY HISTORY Family History  Problem Relation Age of Onset  . Pneumonia Mother   . Asthma Mother   . Diverticulitis Mother   . Glaucoma Mother   . Congestive Heart Failure Father   . CVA Father   . Kidney failure Father   . Glaucoma Father   . Diverticulitis Father   . Colon cancer Neg Hx   . Pancreatic cancer Neg Hx   . Stomach cancer Neg Hx     SOCIAL HISTORY Social History   Tobacco Use  . Smoking status: Current Some Day Smoker  . Smokeless tobacco: Never Used  . Tobacco comment: cigars  Substance Use Topics  . Alcohol use: Yes  . Drug use: No         OPHTHALMIC EXAM: Base Eye Exam    Visual Acuity (ETDRS)      Right Left   Dist [place] CF @ 5' 20/70   Dist ph [place] NI 20/50 +2       Tonometry (Tonopen, 8:49 AM)      Right Left   Pressure 21 23       Pupils      Pupils Dark Light Shape React APD   Right PERRL 4 4 Round Minimal None   Left PERRL 4 4 Round Minimal None       Visual Fields (Counting fingers)      Left Right    Full Full       Extraocular Movement      Right Left    Full Full       Neuro/Psych    Oriented x3: Yes   Mood/Affect: Normal       Dilation    Right eye: 1.0% Mydriacyl, 2.5% Phenylephrine @ 8:54 AM        Slit Lamp and Fundus Exam    External Exam      Right Left   External Normal Normal       Slit Lamp Exam      Right Left   Lids/Lashes Normal Normal    Conjunctiva/Sclera White and quiet White and quiet   Cornea Clear Clear   Anterior Chamber Deep and quiet Deep and quiet   Iris Round and reactive Round and reactive   Lens 3+ Nuclear sclerosis, 1+ Posterior subcapsular cataract 2+ Nuclear sclerosis   Anterior Vitreous Normal Normal       Fundus Exam      Right Left   Posterior Vitreous Posterior vitreous detachment    Disc 2+ Optic disc atrophy, 2+ Pallor,, with collaterals now on the nerve, no NVD    C/D Ratio 0.8    Macula Retinal pigment epithelial mottling, no macular thickening    Vessels Old central retinal vein occlusion of the right eye, no visible NVE    Periphery Good moderate PRP laser scatter pattern           IMAGING AND PROCEDURES  Imaging and Procedures for 06/18/20  OCT, Retina - OU - Both Eyes       Right Eye Quality was good. Scan locations included subfoveal. Central Foveal Thickness: 203. Progression has improved. Findings include abnormal foveal contour, central retinal atrophy, outer retinal atrophy, inner retinal atrophy.   Left Eye Quality was good. Scan locations included subfoveal. Central Foveal Thickness: 247. Progression has been stable.   Notes Vastly improved CME from ischemic CRV O OD now with residual diffuse retinal macular atrophy.  Loss of outer retinal architecture in the foveal region including the photoreceptor layer, ellipsoid zone layer, counts for acuity, no residual CME       Intravitreal Injection, Pharmacologic Agent - OD - Right Eye       Time Out 06/18/2020. 9:36 AM. Confirmed correct patient, procedure, site, and patient consented.   Anesthesia Topical anesthesia was used. Anesthetic medications included Akten 3.5%.   Procedure Preparation included 10% betadine to eyelids, Tobramycin 0.3%.   Injection:  2 mg aflibercept [name] SOLN   NDC: A3590391, Lot: [id number]   Route: Intravitreal, Site: Right Eye, Waste: 0 mg  Post-op Post injection exam found visual  acuity of at least counting fingers. The patient tolerated the  procedure well. There were no complications. The patient received written and verbal post procedure care education. Post injection medications were not given.                 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 The potential sequelae of ischemic central retinal vein occlusion, including macular edema, neovascularization, rubeosis iridis, and neovascular glaucoma, were discussed, and the need for frequent follow-up.  The nature of macular edema and central retinal vein occlusion was discussed. The following options were considered:  1.Observation for a period to look for spontaneous improvement, is no linger the primary therapy. One-third worsen, one-third stay unchanged, and one-third improves.  2. Anti-VEGF Therapy. ( Lucentis, Avastin or Eylea ) injected  in intravitreal fashion, initially monthly then tailored to clinical response.  3. Intravitreal steroid usage, Kenalog, or Ozurdex, usually a second line therapy or in combination with anti-Vegf therapy noted above.  4. Panretinal laser photocoagulation to cause regression of iris neovascularization, or treat retinal  non-perfusion.  5. Surgical Management may include vitrectomy with incisions of peripheral veins to trigger retino choroidal anastomosis formation. This topic presented and discussed at [facility].   OD, vastly improved CME from onset from [place] [facility].  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  Nonetheless vastly improved CME, less neovascularization, on intravitreal Eylea today at 9-week follow-up will repeat injection today and examination in 3 months  Lateralization of the  nerve suggest that this may be now a compensated CRV O  Nuclear sclerotic cataract of both eyes OD, and OS each with cataract and OS with cataract which could explain his current visual functioning in the left eye.  As a precursor to cataract surgery in the left eye, his best sighted eye, I would urge consideration of cataract extraction with intraocular lens placement in the right eye to remove the obstruction to medical monitoring of his retinal condition in the right eye, maximize visual potential, as well as prepare him psychologically for cataract surgery in the left eye should he choose to improve his acuity     Central retinal artery occlusion of right eye Likely some aspect of this accounts for the diffuse retinal atrophy, no active disease now      ICD-10-CM   1. 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 Nuclear sclerotic cataract of both eyes  H25.13   3. Central retinal artery occlusion of right eye  H34.11     1.  Repeat injection intravitreal Eylea OD today to control macular edema and extend interval of examination out of 3 months  2.  Per Dr. Delman Cheadle as scheduled control and monitoring of glaucoma.  New  3.  Follow-up as well for potential evaluation of cataract surgery right eye to allow enhanced and improved medical monitoring of his severe retinal condition.  Or over cataract surgery in the right eye would prepare the patient and his surgeon for possible cataract surgery in the left eye in the future should he choose to improve his quality of vision   Ophthalmic Meds Ordered this visit:  Meds ordered this  encounter  Medications  . aflibercept (EYLEA) SOLN 2 mg       Return in about 3 months (around 09/18/2020) for DILATE OU, COLOR FP, EYLEA OCT, OD.  There are no Patient Instructions on file for this  visit.   Explained the diagnoses, plan, and follow up with the patient and they expressed understanding.  Patient expressed understanding of the importance of proper follow up care.   Clent Demark Almira Phetteplace M.D. Diseases & Surgery of the Retina and Vitreous Retina & Diabetic [place] 06/18/20     Abbreviations: M myopia (nearsighted); A astigmatism; H hyperopia (farsighted); P presbyopia; Mrx spectacle prescription;  CTL contact lenses; OD right eye; OS left eye; OU both eyes  XT exotropia; ET esotropia; PEK punctate epithelial keratitis; PEE punctate epithelial erosions; DES dry eye syndrome; MGD meibomian gland dysfunction; ATs artificial tears; PFAT's preservative free artificial tears; [place] nuclear sclerotic cataract; PSC posterior subcapsular cataract; ERM epi-retinal membrane; PVD posterior vitreous detachment; RD retinal detachment; DM diabetes mellitus; DR diabetic retinopathy; NPDR non-proliferative diabetic retinopathy; PDR proliferative diabetic retinopathy; CSME clinically significant macular edema; DME diabetic macular edema; dbh dot blot hemorrhages; CWS cotton wool spot; POAG primary open angle glaucoma; C/D cup-to-disc ratio; HVF humphrey visual field; GVF goldmann visual field; OCT optical coherence tomography; IOP intraocular pressure; BRVO Branch retinal vein occlusion; CRVO central retinal vein occlusion; CRAO central retinal artery occlusion; BRAO branch retinal artery occlusion; RT retinal tear; SB scleral buckle; PPV pars plana vitrectomy; VH Vitreous hemorrhage; PRP panretinal laser photocoagulation; IVK intravitreal kenalog; VMT vitreomacular traction; MH Macular hole;  NVD neovascularization of the disc; NVE neovascularization elsewhere; AREDS age related eye disease study; ARMD age related macular degeneration; POAG primary open angle glaucoma; EBMD epithelial/anterior basement membrane dystrophy; ACIOL anterior chamber intraocular lens; IOL intraocular lens; PCIOL posterior chamber  intraocular lens; Phaco/IOL phacoemulsification with intraocular lens placement; [place] photorefractive keratectomy; LASIK laser assisted in situ keratomileusis; HTN hypertension; DM diabetes mellitus; COPD chronic obstructive pulmonary disease

## 2020-07-17 DIAGNOSIS — Z1212 Encounter for screening for malignant neoplasm of rectum: Secondary | ICD-10-CM | POA: Diagnosis not present

## 2020-07-25 DIAGNOSIS — H25043 Posterior subcapsular polar age-related cataract, bilateral: Secondary | ICD-10-CM | POA: Diagnosis not present

## 2020-07-25 DIAGNOSIS — H2513 Age-related nuclear cataract, bilateral: Secondary | ICD-10-CM | POA: Diagnosis not present

## 2020-07-25 DIAGNOSIS — H401132 Primary open-angle glaucoma, bilateral, moderate stage: Secondary | ICD-10-CM | POA: Diagnosis not present

## 2020-07-30 DIAGNOSIS — H25041 Posterior subcapsular polar age-related cataract, right eye: Secondary | ICD-10-CM | POA: Diagnosis not present

## 2020-07-30 DIAGNOSIS — H2511 Age-related nuclear cataract, right eye: Secondary | ICD-10-CM | POA: Diagnosis not present

## 2020-07-30 DIAGNOSIS — H25811 Combined forms of age-related cataract, right eye: Secondary | ICD-10-CM | POA: Diagnosis not present

## 2020-09-19 ENCOUNTER — Ambulatory Visit (INDEPENDENT_AMBULATORY_CARE_PROVIDER_SITE_OTHER): Payer: Medicare PPO | Admitting: Ophthalmology

## 2020-09-19 ENCOUNTER — Other Ambulatory Visit: Payer: Self-pay

## 2020-09-19 ENCOUNTER — Encounter (INDEPENDENT_AMBULATORY_CARE_PROVIDER_SITE_OTHER): Payer: Self-pay | Admitting: Ophthalmology

## 2020-09-19 DIAGNOSIS — H35351 Cystoid macular degeneration, right eye: Secondary | ICD-10-CM

## 2020-09-19 DIAGNOSIS — H2512 Age-related nuclear cataract, left eye: Secondary | ICD-10-CM | POA: Diagnosis not present

## 2020-09-19 DIAGNOSIS — H34811 Central retinal vein occlusion, right eye, with macular edema: Secondary | ICD-10-CM | POA: Diagnosis not present

## 2020-09-19 DIAGNOSIS — Z961 Presence of intraocular lens: Secondary | ICD-10-CM

## 2020-09-19 DIAGNOSIS — H4051X2 Glaucoma secondary to other eye disorders, right eye, moderate stage: Secondary | ICD-10-CM | POA: Diagnosis not present

## 2020-09-19 MED ORDER — AFLIBERCEPT 2MG/0.05ML IZ SOLN FOR KALEIDOSCOPE
2.0000 mg | INTRAVITREAL | Status: AC | PRN
  Administered 2020-09-19: 2 mg via INTRAVITREAL

## 2020-09-19 NOTE — Assessment & Plan Note (Signed)
Labile eyes OD in the past on every 8 week injection Eylea, now at 3 months some recurrence perifoveal CME.  We will repeat injection Eylea today and examination again in 10 weeks

## 2020-09-19 NOTE — Progress Notes (Signed)
09/19/2020     CHIEF COMPLAINT Patient presents for Retina Follow Up (3 Month CRVO f\u. Possible Eylea OD. OCT and FP/Pt states vision is stable. Pt recently had cat sx OD. Denies any complaints.)   HISTORY OF PRESENT ILLNESS: Ralph Harper is a 80 y.o. male who presents to the clinic today for:   HPI    Retina Follow Up    Patient presents with  CRVO/BRVO.  In right eye.  Severity is moderate.  Duration of 3 months.  Since onset it is stable.  I, the attending physician,  performed the HPI with the patient and updated documentation appropriately. Additional comments: 3 Month CRVO f\u. Possible Eylea OD. OCT and FP Pt states vision is stable. Pt recently had cat sx OD. Denies any complaints.       Last edited by Tilda Franco on 09/19/2020  9:57 AM. (History)      Referring physician: Prince Solian, MD [place],  [place] [address]  HISTORICAL INFORMATION:   Selected notes from the MEDICAL RECORD NUMBER       CURRENT MEDICATIONS: Current Outpatient Medications (Ophthalmic Drugs)  Medication Sig  . latanoprost (XALATAN) 0.005 % ophthalmic solution Place 1 drop into both eyes at bedtime.  Marland Kitchen TIMOPTIC 0.5 % ophthalmic solution Place 1 drop into the right eye 2 (two) times daily.   No current facility-administered medications for this visit. (Ophthalmic Drugs)   Current Outpatient Medications (Other)  Medication Sig  . allopurinol (ZYLOPRIM) 100 MG tablet   . allopurinol (ZYLOPRIM) 300 MG tablet   . apixaban (ELIQUIS) 5 MG TABS tablet Take 5 mg by mouth daily.  Marland Kitchen aspirin 81 MG tablet Take 81 mg by mouth daily.  Marland Kitchen atenolol (TENORMIN) 100 MG tablet Take 100 mg by mouth daily.  Marland Kitchen atorvastatin (LIPITOR) 40 MG tablet Take 40 mg by mouth daily.  Marland Kitchen diltiazem (TIAZAC) 300 MG 24 hr capsule Take 300 mg by mouth daily.  Marland Kitchen ezetimibe (ZETIA) 10 MG tablet Take 10 mg by mouth daily.  Marland Kitchen levothyroxine (SYNTHROID, LEVOTHROID) 50 MCG tablet Take 50 mcg by mouth daily before  breakfast.  . Multiple Vitamins-Minerals (MULTIVITAMIN PO) Take by mouth.  . Omega-3 Fatty Acids (FISH OIL) 1000 MG CAPS Take by mouth 2 (two) times daily.  . quinapril (ACCUPRIL) 40 MG tablet Take 40 mg by mouth at bedtime.  . valsartan-hydrochlorothiazide (DIOVAN-HCT) 160-12.5 MG tablet TK 1 T PO QD   No current facility-administered medications for this visit. (Other)      REVIEW OF SYSTEMS:    ALLERGIES Allergies  Allergen Reactions  . Brimonidine Itching, Dermatitis and Rash    PAST MEDICAL HISTORY Past Medical History:  Diagnosis Date  . Glaucoma   . Hyperlipidemia   . Hypertension    Past Surgical History:  Procedure Laterality Date  . ANGIOPLASTY    . APPENDECTOMY    . served nerve     hand 2014    FAMILY HISTORY Family History  Problem Relation Age of Onset  . Pneumonia Mother   . Asthma Mother   . Diverticulitis Mother   . Glaucoma Mother   . Congestive Heart Failure Father   . CVA Father   . Kidney failure Father   . Glaucoma Father   . Diverticulitis Father   . Colon cancer Neg Hx   . Pancreatic cancer Neg Hx   . Stomach cancer Neg Hx     SOCIAL HISTORY Social History   Tobacco Use  . Smoking  status: Current Some Day Smoker  . Smokeless tobacco: Never Used  . Tobacco comment: cigars  Substance Use Topics  . Alcohol use: Yes  . Drug use: No         OPHTHALMIC EXAM: Base Eye Exam    Visual Acuity (Snellen - Linear)      Right Left   Dist [place] CF @ 6' 20/400   Dist ph [place] NI 20/60       Tonometry (Tonopen, 10:03 AM)      Right Left   Pressure 18 21       Pupils      Pupils Dark Light Shape React APD   Right PERRL 4 4 Round Minimal None   Left PERRL 4 4 Round Minimal None       Visual Fields (Counting fingers)      Left Right    Full Full       Neuro/Psych    Oriented x3: Yes   Mood/Affect: Normal       Dilation    Both eyes: 1.0% Mydriacyl, 2.5% Phenylephrine @ 10:03 AM        Slit Lamp and Fundus Exam     External Exam      Right Left   External Normal Normal       Slit Lamp Exam      Right Left   Lids/Lashes Normal Normal   Conjunctiva/Sclera White and quiet White and quiet   Cornea Clear Clear   Anterior Chamber Deep and quiet Deep and quiet   Iris Round and reactive Round and reactive   Lens Centered posterior chamber intraocular lens 3+ Nuclear sclerosis   Anterior Vitreous Normal Normal       Fundus Exam      Right Left   Posterior Vitreous Posterior vitreous detachment Normal   Disc 2+ Optic disc atrophy, 2+ Pallor,, with collaterals now on the nerve, no NVD 1+ Pallor   C/D Ratio 0.99 0.9   Macula Retinal pigment epithelial mottling, no macular thickening Normal   Vessels Old central retinal vein occlusion of the right eye, no visible NVE Normal   Periphery Good moderate PRP laser scatter pattern Normal          IMAGING AND PROCEDURES  Imaging and Procedures for 09/19/20  OCT, Retina - OU - Both Eyes       Right Eye Central Foveal Thickness: 248. Progression has worsened. Findings include cystoid macular edema.   Left Eye Central Foveal Thickness: 248. Progression has been stable.   Notes CME has recurred from central retinal vein occlusion right eye now at 70-month follow-up.  In the previous exam periods of 8 weeks, no CME has recurred.       Color Fundus Photography Optos - OU - Both Eyes       Right Eye Progression has been stable. Disc findings include increased cup to disc ratio, pallor, thinning of rim. Vessels : tortuous vessels.   Left Eye Progression has been stable. Disc findings include increased cup to disc ratio, thinning of rim.   Notes Old CRV O with attenuated vessels, and collaterals on the nerve       Intravitreal Injection, Pharmacologic Agent - OD - Right Eye       Time Out 09/19/2020. 11:31 AM. Confirmed correct patient, procedure, site, and patient consented.   Anesthesia Topical anesthesia was used. Anesthetic  medications included Akten 3.5%.   Procedure Preparation included 10% betadine to eyelids, Tobramycin 0.3%. A 30 gauge  needle was used.   Injection:  2 mg aflibercept [name] SOLN   NDC: [id number], Lot: [id number]   Route: Intravitreal, Site: Right Eye, Waste: 0 mg  Post-op Post injection exam found visual acuity of at least counting fingers. The patient tolerated the procedure well. There were no complications. The patient received written and verbal post procedure care education. Post injection medications were not given.                 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  We will repeat injection Eylea today and examination again in 10 weeks      ICD-10-CM   1. 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 Cataract, nuclear sclerotic, left eye  H25.12   3. Pseudophakia of right eye  Z96.1   4. Cystoid macular edema of right eye  H35.351   5. Secondary glaucoma due to combination mechanisms, right, moderate stage  H40.51X2     1.  2.  3.  Ophthalmic Meds Ordered this visit:  Meds ordered this encounter  Medications  . aflibercept (EYLEA) SOLN 2 mg       Return in about 10 weeks (around 11/28/2020) for dilate, OD, EYLEA OCT.  There are no Patient Instructions on file for this visit.   Explained the diagnoses,  plan, and follow up with the patient and they expressed understanding.  Patient expressed understanding of the importance of proper follow up care.   Clent Demark Taten Merrow M.D. Diseases & Surgery of the Retina and Vitreous Retina & Diabetic [place] 09/19/20     Abbreviations: M myopia (nearsighted); A astigmatism; H hyperopia (farsighted); P presbyopia; Mrx spectacle prescription;  CTL contact lenses; OD right eye; OS left eye; OU both eyes  XT exotropia; ET esotropia; PEK punctate epithelial keratitis; PEE punctate epithelial erosions; DES dry eye syndrome; MGD meibomian gland dysfunction; ATs artificial tears; PFAT's preservative free artificial tears; [place] nuclear sclerotic cataract; PSC posterior subcapsular cataract; ERM epi-retinal membrane; PVD posterior vitreous detachment; RD retinal detachment; DM diabetes mellitus; DR diabetic retinopathy; NPDR non-proliferative diabetic retinopathy; PDR proliferative diabetic retinopathy; CSME clinically significant macular edema; DME diabetic macular edema; dbh dot blot hemorrhages; CWS cotton wool spot; POAG primary open angle glaucoma; C/D cup-to-disc ratio; HVF humphrey visual field; GVF goldmann visual field; OCT optical coherence tomography; IOP intraocular pressure; BRVO Branch retinal vein occlusion; CRVO central retinal vein occlusion; CRAO central retinal artery occlusion; BRAO branch retinal artery occlusion; RT retinal tear; SB scleral buckle; PPV pars plana vitrectomy; VH Vitreous hemorrhage; PRP panretinal laser photocoagulation; IVK intravitreal kenalog; VMT vitreomacular traction; MH Macular hole;  NVD neovascularization of the disc; NVE neovascularization elsewhere; AREDS age related eye disease study; ARMD age related macular degeneration; POAG primary open angle glaucoma; EBMD epithelial/anterior basement membrane dystrophy; ACIOL anterior chamber intraocular lens; IOL  intraocular lens; PCIOL posterior chamber intraocular lens; Phaco/IOL  phacoemulsification with intraocular lens placement; [place] photorefractive keratectomy; LASIK laser assisted in situ keratomileusis; HTN hypertension; DM diabetes mellitus; COPD chronic obstructive pulmonary disease

## 2020-09-19 NOTE — Assessment & Plan Note (Signed)
As part of CRV O has recurred at 59-month follow-up will need repeat injection for treatment of CRV O with macular edema

## 2020-09-19 NOTE — Assessment & Plan Note (Signed)
Patient reports plan cataract extraction with intraocular displacement in the left eye upcoming

## 2020-10-15 DIAGNOSIS — H25812 Combined forms of age-related cataract, left eye: Secondary | ICD-10-CM | POA: Diagnosis not present

## 2020-10-15 DIAGNOSIS — H2512 Age-related nuclear cataract, left eye: Secondary | ICD-10-CM | POA: Diagnosis not present

## 2020-10-15 DIAGNOSIS — H25042 Posterior subcapsular polar age-related cataract, left eye: Secondary | ICD-10-CM | POA: Diagnosis not present

## 2020-11-14 DIAGNOSIS — H401132 Primary open-angle glaucoma, bilateral, moderate stage: Secondary | ICD-10-CM | POA: Diagnosis not present

## 2020-11-25 DIAGNOSIS — Z961 Presence of intraocular lens: Secondary | ICD-10-CM | POA: Diagnosis not present

## 2020-11-27 ENCOUNTER — Encounter (INDEPENDENT_AMBULATORY_CARE_PROVIDER_SITE_OTHER): Payer: Medicare PPO | Admitting: Ophthalmology

## 2020-11-27 ENCOUNTER — Ambulatory Visit (INDEPENDENT_AMBULATORY_CARE_PROVIDER_SITE_OTHER): Payer: Medicare PPO | Admitting: Ophthalmology

## 2020-11-27 ENCOUNTER — Encounter (INDEPENDENT_AMBULATORY_CARE_PROVIDER_SITE_OTHER): Payer: Self-pay | Admitting: Ophthalmology

## 2020-11-27 ENCOUNTER — Other Ambulatory Visit: Payer: Self-pay

## 2020-11-27 DIAGNOSIS — Z961 Presence of intraocular lens: Secondary | ICD-10-CM

## 2020-11-27 DIAGNOSIS — H34811 Central retinal vein occlusion, right eye, with macular edema: Secondary | ICD-10-CM

## 2020-11-27 MED ORDER — AFLIBERCEPT 2MG/0.05ML IZ SOLN FOR KALEIDOSCOPE
2.0000 mg | INTRAVITREAL | Status: AC | PRN
  Administered 2020-11-27: 2 mg via INTRAVITREAL

## 2020-11-27 NOTE — Progress Notes (Signed)
11/27/2020     CHIEF COMPLAINT Patient presents for Retina Follow Up (10wk fu OD/ Eylea OD//Pt states, " I just had my second cataract sx on OS a couple of weeks ago. I can see a little better but I cannot tell the biggest difference."//Pt reports using Timolol BID OD and Latanoprost QHS OU)   HISTORY OF PRESENT ILLNESS: Ralph Harper is a 80 y.o. male who presents to the clinic today for:   HPI    Retina Follow Up    Patient presents with  CRVO/BRVO.  In right eye.  This started 10 weeks ago.  Duration of 10 weeks.  Since onset it is stable. Additional comments: 10wk fu OD/ Eylea OD  Pt states, " I just had my second cataract sx on OS a couple of weeks ago. I can see a little better but I cannot tell the biggest difference."  Pt reports using Timolol BID OD and Latanoprost QHS OU       Last edited by Kendra Opitz, COA on 11/27/2020  8:12 AM. (History)      Referring physician: Prince Solian, MD [place],  [place] [address]  HISTORICAL INFORMATION:   Selected notes from the MEDICAL RECORD NUMBER       CURRENT MEDICATIONS: Current Outpatient Medications (Ophthalmic Drugs)  Medication Sig  . latanoprost (XALATAN) 0.005 % ophthalmic solution Place 1 drop into both eyes at bedtime.  Marland Kitchen TIMOPTIC 0.5 % ophthalmic solution Place 1 drop into the right eye 2 (two) times daily.   No current facility-administered medications for this visit. (Ophthalmic Drugs)   Current Outpatient Medications (Other)  Medication Sig  . allopurinol (ZYLOPRIM) 100 MG tablet   . allopurinol (ZYLOPRIM) 300 MG tablet   . apixaban (ELIQUIS) 5 MG TABS tablet Take 5 mg by mouth daily.  Marland Kitchen aspirin 81 MG tablet Take 81 mg by mouth daily.  Marland Kitchen atenolol (TENORMIN) 100 MG tablet Take 100 mg by mouth daily.  Marland Kitchen atorvastatin (LIPITOR) 40 MG tablet Take 40 mg by mouth daily.  Marland Kitchen diltiazem (TIAZAC) 300 MG 24 hr capsule Take 300 mg by mouth daily.  Marland Kitchen ezetimibe (ZETIA) 10 MG tablet Take 10 mg by mouth  daily.  Marland Kitchen levothyroxine (SYNTHROID, LEVOTHROID) 50 MCG tablet Take 50 mcg by mouth daily before breakfast.  . Multiple Vitamins-Minerals (MULTIVITAMIN PO) Take by mouth.  . Omega-3 Fatty Acids (FISH OIL) 1000 MG CAPS Take by mouth 2 (two) times daily.  . quinapril (ACCUPRIL) 40 MG tablet Take 40 mg by mouth at bedtime.  . valsartan-hydrochlorothiazide (DIOVAN-HCT) 160-12.5 MG tablet TK 1 T PO QD   No current facility-administered medications for this visit. (Other)      REVIEW OF SYSTEMS:    ALLERGIES Allergies  Allergen Reactions  . Brimonidine Itching, Dermatitis and Rash    PAST MEDICAL HISTORY Past Medical History:  Diagnosis Date  . Cataract, nuclear sclerotic, left eye 11/14/2019  . Glaucoma   . Hyperlipidemia   . Hypertension    Past Surgical History:  Procedure Laterality Date  . ANGIOPLASTY    . APPENDECTOMY    . served nerve     hand 2014    FAMILY HISTORY Family History  Problem Relation Age of Onset  . Pneumonia Mother   . Asthma Mother   . Diverticulitis Mother   . Glaucoma Mother   . Congestive Heart Failure Father   . CVA Father   . Kidney failure Father   . Glaucoma Father   .  Diverticulitis Father   . Colon cancer Neg Hx   . Pancreatic cancer Neg Hx   . Stomach cancer Neg Hx     SOCIAL HISTORY Social History   Tobacco Use  . Smoking status: Current Some Day Smoker  . Smokeless tobacco: Never Used  . Tobacco comment: cigars  Substance Use Topics  . Alcohol use: Yes  . Drug use: No         OPHTHALMIC EXAM: Base Eye Exam    Visual Acuity (ETDRS)      Right Left   Dist [place] 20/400 20/20 -2   Dist ph [place] NI    Correction: Glasses       Tonometry (Tonopen, 8:17 AM)      Right Left   Pressure 14 14       Pupils      Pupils Dark Light Shape React APD   Right PERRL 4 4 Round Minimal None   Left PERRL 4 4 Round Minimal None       Visual Fields (Counting fingers)      Left Right    Full Full       Extraocular Movement       Right Left    Full Full       Neuro/Psych    Oriented x3: Yes   Mood/Affect: Normal       Dilation    Right eye: 1.0% Mydriacyl, 2.5% Phenylephrine @ 8:17 AM        Slit Lamp and Fundus Exam    External Exam      Right Left   External Normal Normal       Slit Lamp Exam      Right Left   Lids/Lashes Normal Normal   Conjunctiva/Sclera White and quiet White and quiet   Cornea Clear Clear   Anterior Chamber Deep and quiet Deep and quiet   Iris Round and reactive Round and reactive   Lens Centered posterior chamber intraocular lens Centered posterior chamber intraocular lens   Anterior Vitreous Normal Normal       Fundus Exam      Right Left   Posterior Vitreous Posterior vitreous detachment Normal   Disc 2+ Optic disc atrophy, 2+ Pallor,, with collaterals now on the nerve, no NVD 1+ Pallor   C/D Ratio 0.99 0.9   Macula Retinal pigment epithelial mottling, no macular thickening Normal   Vessels Old central retinal vein occlusion of the right eye, no visible NVE Normal   Periphery Good moderate PRP laser scatter pattern Normal          IMAGING AND PROCEDURES  Imaging and Procedures for 11/27/20  OCT, Retina - OU - Both Eyes       Right Eye Quality was good. Scan locations included subfoveal. Central Foveal Thickness: 214. Progression has worsened. Findings include cystoid macular edema, abnormal foveal contour.   Left Eye Quality was good. Central Foveal Thickness: 256. Progression has been stable. Findings include abnormal foveal contour.   Notes CME has recurred from central retinal vein occlusion right eye now at 10 weeks follow-up.  In the previous exam periods of 8 weeks, no CME has recurred. OD, some atrophy in the foveal region  OS with thin RNFL         Intravitreal Injection, Pharmacologic Agent - OD - Right Eye       Time Out 11/27/2020. 9:10 AM. Confirmed correct patient, procedure, site, and patient consented.   Anesthesia Topical  anesthesia was used. Anesthetic  medications included Akten 3.5%.   Procedure Preparation included 10% betadine to eyelids, Tobramycin 0.3%. A 30 gauge needle was used.   Injection:  2 mg aflibercept [name] SOLN   NDC: A3590391, Lot: [id number]   Route: Intravitreal, Site: Right Eye, Waste: 0 mg  Post-op Post injection exam found visual acuity of at least counting fingers. The patient tolerated the procedure well. There were no complications. The patient received written and verbal post procedure care education. Post injection medications were not given.                 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    20/400 vision is now obtained in the right eye  Currently at 10-week follow-up we will repeat injection today and again we will extend interval examination next    Pseudophakia, left eye Visual acuity is improved nicely OS      ICD-10-CM   1. 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 Pseudophakia, left eye  Z96.1     1.  Follow-up with Dr. Katy Apo as scheduled  2.  With less CME OD from CRV O, may be able to 1-Day be able to lengthen follow-up visits and potentially cease use of antivegF  3.  Ophthalmic Meds Ordered this visit:  Meds ordered this encounter  Medications  . aflibercept (EYLEA) SOLN 2 mg       Return in about 3 months (around 02/26/2021) for dilate, OD, EYLEA OCT.  There are no Patient Instructions on file for this visit.   Explained the diagnoses, plan, and follow up with the patient and they expressed understanding.  Patient expressed understanding of the importance of proper  follow up care.   Clent Demark Shishir Krantz M.D. Diseases & Surgery of the Retina and Vitreous Retina & Diabetic [place] 11/27/20     Abbreviations: M myopia (nearsighted); A astigmatism; H hyperopia (farsighted); P presbyopia; Mrx spectacle prescription;  CTL contact lenses; OD right eye; OS left eye; OU both eyes  XT exotropia; ET esotropia; PEK punctate epithelial keratitis; PEE punctate epithelial erosions; DES dry eye syndrome; MGD meibomian gland dysfunction; ATs artificial tears; PFAT's preservative free artificial tears; [place] nuclear sclerotic cataract; PSC posterior subcapsular cataract; ERM epi-retinal membrane; PVD posterior vitreous detachment; RD retinal detachment; DM diabetes mellitus; DR diabetic retinopathy; NPDR non-proliferative diabetic retinopathy; PDR proliferative diabetic retinopathy; CSME clinically significant macular edema; DME diabetic macular edema; dbh dot blot hemorrhages; CWS cotton wool spot; POAG primary open angle glaucoma; C/D cup-to-disc ratio; HVF humphrey visual field; GVF goldmann visual field; OCT optical coherence tomography; IOP intraocular pressure; BRVO Branch retinal vein occlusion; CRVO central retinal vein occlusion; CRAO central retinal artery occlusion; BRAO branch retinal artery occlusion; RT retinal tear; SB scleral buckle; PPV pars plana vitrectomy; VH Vitreous hemorrhage; PRP panretinal laser photocoagulation; IVK intravitreal kenalog; VMT vitreomacular traction; MH Macular hole;  NVD neovascularization of the disc; NVE neovascularization elsewhere; AREDS age related eye disease study; ARMD age related macular degeneration; POAG primary open angle glaucoma; EBMD epithelial/anterior basement membrane dystrophy; ACIOL anterior chamber intraocular lens; IOL intraocular lens; PCIOL posterior chamber intraocular lens; Phaco/IOL phacoemulsification with intraocular  lens placement; [place] photorefractive keratectomy; LASIK laser assisted in situ keratomileusis; HTN  hypertension; DM diabetes mellitus; COPD chronic obstructive pulmonary disease

## 2020-11-27 NOTE — Assessment & Plan Note (Signed)
CRV O with CME continues to have a slow improvement and vision limited by of central foveal atrophy but overall improved post recent cataract surgery.    20/400 vision is now obtained in the right eye  Currently at 10-week follow-up we will repeat injection today and again we will extend interval examination next

## 2020-11-27 NOTE — Assessment & Plan Note (Signed)
Visual acuity is improved nicely OS

## 2021-02-18 DIAGNOSIS — H401132 Primary open-angle glaucoma, bilateral, moderate stage: Secondary | ICD-10-CM | POA: Diagnosis not present

## 2021-02-25 ENCOUNTER — Ambulatory Visit (INDEPENDENT_AMBULATORY_CARE_PROVIDER_SITE_OTHER): Payer: Medicare PPO | Admitting: Ophthalmology

## 2021-02-25 ENCOUNTER — Encounter (INDEPENDENT_AMBULATORY_CARE_PROVIDER_SITE_OTHER): Payer: Self-pay | Admitting: Ophthalmology

## 2021-02-25 ENCOUNTER — Other Ambulatory Visit: Payer: Self-pay

## 2021-02-25 DIAGNOSIS — H34811 Central retinal vein occlusion, right eye, with macular edema: Secondary | ICD-10-CM | POA: Diagnosis not present

## 2021-02-25 DIAGNOSIS — Z961 Presence of intraocular lens: Secondary | ICD-10-CM | POA: Diagnosis not present

## 2021-02-25 MED ORDER — AFLIBERCEPT 2MG/0.05ML IZ SOLN FOR KALEIDOSCOPE
2.0000 mg | INTRAVITREAL | Status: AC | PRN
  Administered 2021-02-25: 2 mg via INTRAVITREAL

## 2021-02-25 NOTE — Assessment & Plan Note (Signed)
Recent cataract extraction looks great

## 2021-02-25 NOTE — Assessment & Plan Note (Addendum)
The nature of central retinal vein occlusion was discussed with the patient including the division of types into nonischemic ischemic. The potential sequelae of ischemic central retinal vein occlusion, including macular edema, neovascularization, rubeosis iridis, and neovascular glaucoma, were discussed, and the need for frequent follow-up.  The nature of macular edema and central retinal vein occlusion was discussed. The following options were considered:  1.Observation for a period to look for spontaneous improvement, is no linger the primary therapy. One-third worsen, one-third stay unchanged, and one-third improves.  2. Anti-VEGF Therapy. ( Lucentis, Avastin or Eylea ) injected  in intravitreal fashion, initially monthly then tailored to clinical response.  3. Intravitreal steroid usage, Kenalog, or Ozurdex, usually a second line therapy or in combination with anti-Vegf therapy noted above.  4. Panretinal laser photocoagulation to cause regression of iris neovascularization, or treat retinal  non-perfusion.  5. Surgical Management may include vitrectomy with incisions of peripheral veins to trigger retino choroidal anastomosis formation. This topic presented and discussed at [place].  No recurrence of CME thus will repeat injection today at 25-month interval extend interval examination next to 4 months with possible injection next

## 2021-02-25 NOTE — Progress Notes (Signed)
02/25/2021     CHIEF COMPLAINT Patient presents for Retina Evaluation (OD, currently 2-month follow-up for previous central retinal vein occlusion with secondary CSME.) and Retina Follow Up (3 Month CRVO f\u. Possible Eylea OD. OCT and FP/Pt states vision is stable. Pt recently had cat sx OD. Denies any complaints.)   HISTORY OF PRESENT ILLNESS: Ralph Harper is a 80 y.o. male who presents to the clinic today for:   HPI     Retina Evaluation           Laterality: right eye   Comments: OD, currently 30-month follow-up for previous central retinal vein occlusion with secondary CSME.         Retina Follow Up           Diagnosis: CRVO/BRVO   Laterality: right eye   Severity: moderate   Duration: 3 months   Course: stable   MD Performed: performed the HPI with the patient and updated documentation appropriately   Comments: 3 Month CRVO f\u. Possible Eylea OD. OCT and FP Pt states vision is stable. Pt recently had cat sx OD. Denies any complaints.       Last edited by Hurman Horn, MD on 02/25/2021  8:44 AM.      Referring physician: Prince Solian, MD [place],  [place] [address]  HISTORICAL INFORMATION:   Selected notes from the MEDICAL RECORD NUMBER       CURRENT MEDICATIONS: Current Outpatient Medications (Ophthalmic Drugs)  Medication Sig   latanoprost (XALATAN) 0.005 % ophthalmic solution Place 1 drop into both eyes at bedtime.   TIMOPTIC 0.5 % ophthalmic solution Place 1 drop into the right eye 2 (two) times daily.   No current facility-administered medications for this visit. (Ophthalmic Drugs)   Current Outpatient Medications (Other)  Medication Sig   allopurinol (ZYLOPRIM) 100 MG tablet    allopurinol (ZYLOPRIM) 300 MG tablet    apixaban (ELIQUIS) 5 MG TABS tablet Take 5 mg by mouth daily.   aspirin 81 MG tablet Take 81 mg by mouth daily.   atenolol (TENORMIN) 100 MG tablet Take 100 mg by mouth daily.   atorvastatin (LIPITOR) 40 MG  tablet Take 40 mg by mouth daily.   diltiazem (TIAZAC) 300 MG 24 hr capsule Take 300 mg by mouth daily.   ezetimibe (ZETIA) 10 MG tablet Take 10 mg by mouth daily.   levothyroxine (SYNTHROID, LEVOTHROID) 50 MCG tablet Take 50 mcg by mouth daily before breakfast.   Multiple Vitamins-Minerals (MULTIVITAMIN PO) Take by mouth.   Omega-3 Fatty Acids (FISH OIL) 1000 MG CAPS Take by mouth 2 (two) times daily.   quinapril (ACCUPRIL) 40 MG tablet Take 40 mg by mouth at bedtime.   valsartan-hydrochlorothiazide (DIOVAN-HCT) 160-12.5 MG tablet TK 1 T PO QD   No current facility-administered medications for this visit. (Other)      REVIEW OF SYSTEMS:    ALLERGIES Allergies  Allergen Reactions   Brimonidine Itching, Dermatitis and Rash    PAST MEDICAL HISTORY Past Medical History:  Diagnosis Date   Cataract, nuclear sclerotic, left eye 11/14/2019   Glaucoma    Hyperlipidemia    Hypertension    Past Surgical History:  Procedure Laterality Date   ANGIOPLASTY     APPENDECTOMY     served nerve     hand 2014    FAMILY HISTORY Family History  Problem Relation Age of Onset   Pneumonia Mother    Asthma Mother    Diverticulitis Mother  Glaucoma Mother    Congestive Heart Failure Father    CVA Father    Kidney failure Father    Glaucoma Father    Diverticulitis Father    Colon cancer Neg Hx    Pancreatic cancer Neg Hx    Stomach cancer Neg Hx     SOCIAL HISTORY Social History   Tobacco Use   Smoking status: Some Days   Smokeless tobacco: Never   Tobacco comments:    cigars  Substance Use Topics   Alcohol use: Yes   Drug use: No         OPHTHALMIC EXAM:  Base Eye Exam     Visual Acuity (ETDRS)       Right Left   Dist cc CF at 5' 20/20 +2         Tonometry (Tonopen, 8:47 AM)       Right Left   Pressure 14 17         Pupils       Pupils APD   Right PERRL None   Left PERRL None         Visual Fields       Left Right   Restrictions Partial  outer superior temporal, inferior temporal, superior nasal, inferior nasal deficiencies Partial outer superior temporal, inferior temporal, superior nasal, inferior nasal deficiencies         Extraocular Movement       Right Left    Full Full         Neuro/Psych     Oriented x3: Yes   Mood/Affect: Normal           Slit Lamp and Fundus Exam     External Exam       Right Left   External Normal Normal         Slit Lamp Exam       Right Left   Lids/Lashes Normal Normal   Conjunctiva/Sclera White and quiet White and quiet   Cornea Clear Clear   Anterior Chamber Deep and quiet Deep and quiet   Iris Round and reactive Round and reactive   Lens Centered posterior chamber intraocular lens Centered posterior chamber intraocular lens   Anterior Vitreous Normal Normal         Fundus Exam       Right Left   Posterior Vitreous Posterior vitreous detachment Normal   Disc 2+ Optic disc atrophy, 2+ Pallor,, with collaterals now on the nerve, no NVD 1+ Pallor   C/D Ratio 0.99 0.9   Macula Retinal pigment epithelial mottling, no macular thickening, and hyperpigmentation in the fovea, no obvious CME Normal   Vessels Old central retinal vein occlusion of the right eye, no visible NVE Normal   Periphery Good moderate PRP laser scatter pattern Normal            IMAGING AND PROCEDURES  Imaging and Procedures for 02/25/21  OCT, Retina - OU - Both Eyes       Right Eye Quality was good. Scan locations included subfoveal. Central Foveal Thickness: 216. Progression has improved. Findings include cystoid macular edema, abnormal foveal contour.   Left Eye Quality was poor.   Notes CME has resolved from central retinal vein occlusion right eye now at 12 weeks follow-up.           Intravitreal Injection, Pharmacologic Agent - OD - Right Eye       Time Out 02/25/2021. 9:30 AM. Confirmed correct patient, procedure, site, and patient consented.  Anesthesia Topical anesthesia was used. Anesthetic medications included Akten 3.5%.   Procedure Preparation included 10% betadine to eyelids, Tobramycin 0.3%, 5% betadine to ocular surface. A 30 gauge needle was used.   Injection: 2 mg aflibercept 2 MG/0.05ML   Route: Intravitreal, Site: Right Eye   NDC: [id number], Lot: [id number], Waste: 0 mL   Post-op Post injection exam found visual acuity of at least counting fingers. The patient tolerated the procedure well. There were no complications. The patient received written and verbal post procedure care education. Post injection medications were not given.              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 The potential sequelae of ischemic central retinal vein occlusion, including macular edema, neovascularization, rubeosis iridis, and neovascular glaucoma, were discussed, and the need for frequent follow-up.  The nature of macular edema and central retinal vein occlusion was discussed. The following options were considered:  1.Observation for a period to look for spontaneous improvement, is no linger the primary therapy. One-third worsen, one-third stay unchanged, and one-third improves.  2. Anti-VEGF Therapy. ( Lucentis, Avastin or Eylea ) injected  in intravitreal fashion, initially monthly then tailored to clinical response.  3. Intravitreal steroid usage, Kenalog, or Ozurdex, usually a second line therapy or in combination with anti-Vegf therapy noted above.  4. Panretinal laser photocoagulation to cause regression of iris neovascularization, or treat retinal  non-perfusion.  5. Surgical Management may include vitrectomy with incisions of peripheral veins to trigger retino choroidal anastomosis formation. This topic presented and discussed at  [place].      ICD-10-CM   1. 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  OD, with much less central cystoid macular edema most likely on the basis of retinal nonperfusion from combined central retinal artery occlusion in the past but also advanced glaucoma in the past.  Collateralization of the optic nerve also contributes to no recurrence of CME now at 72-month follow-up post Eylea.  2.  We will repeat injection today of Eylea at 68-month follow-up and plan for dilate examination right eye in 4 months with no planned injection of Eylea but prepared to inject if necessary  3.  Ophthalmic Meds Ordered this visit:  Meds ordered this encounter  Medications   aflibercept (EYLEA) SOLN 2 mg       Return in about 4 months (around 06/28/2021) for DILATE OU, EYLEA OCT, OD.  There are no Patient Instructions on file for this visit.   Explained the diagnoses, plan, and follow up with the patient and they expressed understanding.  Patient expressed understanding of the importance of proper follow up care.   Clent Demark Sarajean Dessert M.D. Diseases & Surgery of the Retina and Vitreous Retina & Diabetic [place] 02/25/21     Abbreviations: M myopia (nearsighted); A astigmatism; H hyperopia (farsighted); P presbyopia; Mrx spectacle prescription;  CTL contact lenses; OD right eye; OS left eye; OU both eyes  XT exotropia; ET esotropia; PEK punctate epithelial keratitis; PEE punctate epithelial erosions; DES dry eye syndrome; MGD meibomian gland dysfunction; ATs artificial tears; PFAT's preservative free artificial tears; [place] nuclear sclerotic cataract;  PSC posterior subcapsular cataract; ERM epi-retinal membrane; PVD posterior vitreous detachment; RD retinal detachment; DM diabetes mellitus; DR diabetic retinopathy; NPDR non-proliferative  diabetic retinopathy; PDR proliferative diabetic retinopathy; CSME clinically significant macular edema; DME diabetic macular edema; dbh dot blot hemorrhages; CWS cotton wool spot; POAG primary open angle glaucoma; C/D cup-to-disc ratio; HVF humphrey visual field; GVF goldmann visual field; OCT optical coherence tomography; IOP intraocular pressure; BRVO Branch retinal vein occlusion; CRVO central retinal vein occlusion; CRAO central retinal artery occlusion; BRAO branch retinal artery occlusion; RT retinal tear; SB scleral buckle; PPV pars plana vitrectomy; VH Vitreous hemorrhage; PRP panretinal laser photocoagulation; IVK intravitreal kenalog; VMT vitreomacular traction; MH Macular hole;  NVD neovascularization of the disc; NVE neovascularization elsewhere; AREDS age related eye disease study; ARMD age related macular degeneration; POAG primary open angle glaucoma; EBMD epithelial/anterior basement membrane dystrophy; ACIOL anterior chamber intraocular lens; IOL intraocular lens; PCIOL posterior chamber intraocular lens; Phaco/IOL phacoemulsification with intraocular lens placement; [place] photorefractive keratectomy; LASIK laser assisted in situ keratomileusis; HTN hypertension; DM diabetes mellitus; COPD chronic obstructive pulmonary disease

## 2021-02-26 ENCOUNTER — Encounter (INDEPENDENT_AMBULATORY_CARE_PROVIDER_SITE_OTHER): Payer: Medicare PPO | Admitting: Ophthalmology

## 2021-04-06 ENCOUNTER — Other Ambulatory Visit (INDEPENDENT_AMBULATORY_CARE_PROVIDER_SITE_OTHER): Payer: Self-pay | Admitting: Ophthalmology

## 2021-05-28 ENCOUNTER — Encounter (INDEPENDENT_AMBULATORY_CARE_PROVIDER_SITE_OTHER): Payer: Medicare PPO | Admitting: Ophthalmology

## 2021-06-26 ENCOUNTER — Encounter (INDEPENDENT_AMBULATORY_CARE_PROVIDER_SITE_OTHER): Payer: Medicare PPO | Admitting: Ophthalmology

## 2021-06-30 ENCOUNTER — Other Ambulatory Visit: Payer: Self-pay

## 2021-06-30 ENCOUNTER — Encounter (INDEPENDENT_AMBULATORY_CARE_PROVIDER_SITE_OTHER): Payer: Self-pay | Admitting: Ophthalmology

## 2021-06-30 ENCOUNTER — Ambulatory Visit (INDEPENDENT_AMBULATORY_CARE_PROVIDER_SITE_OTHER): Payer: Medicare PPO | Admitting: Ophthalmology

## 2021-06-30 DIAGNOSIS — H34811 Central retinal vein occlusion, right eye, with macular edema: Secondary | ICD-10-CM

## 2021-06-30 DIAGNOSIS — H4051X2 Glaucoma secondary to other eye disorders, right eye, moderate stage: Secondary | ICD-10-CM | POA: Diagnosis not present

## 2021-06-30 DIAGNOSIS — H3411 Central retinal artery occlusion, right eye: Secondary | ICD-10-CM

## 2021-06-30 DIAGNOSIS — H401122 Primary open-angle glaucoma, left eye, moderate stage: Secondary | ICD-10-CM | POA: Diagnosis not present

## 2021-06-30 MED ORDER — AFLIBERCEPT 2MG/0.05ML IZ SOLN FOR KALEIDOSCOPE
2.0000 mg | INTRAVITREAL | Status: AC | PRN
  Administered 2021-06-30: 2 mg via INTRAVITREAL

## 2021-06-30 NOTE — Assessment & Plan Note (Signed)
Continues under the care of Dr. Craig Guess

## 2021-06-30 NOTE — Assessment & Plan Note (Signed)
Accounts for acuity OD 

## 2021-06-30 NOTE — Progress Notes (Signed)
06/30/2021     CHIEF COMPLAINT Patient presents for  Chief Complaint  Patient presents with   Retina Follow Up    3 Month CRVO f\u. Possible Eylea OD. OCT and FP Pt states vision is stable. Pt recently had cat sx OD. Denies any complaints.      HISTORY OF PRESENT ILLNESS: Ralph Harper is a 80 y.o. male who presents to the clinic today for:   HPI     Retina Follow Up   Patient presents with  CRVO/BRVO.  In right eye.  This started 4 months ago.  Severity is moderate.  Duration of 4 months.  Since onset it is stable.  I, the attending physician,  performed the HPI with the patient and updated documentation appropriately. Additional comments: 3 Month CRVO f\u. Possible Eylea OD. OCT and FP Pt states vision is stable. Pt recently had cat sx OD. Denies any complaints.        Comments   4 mos fu OU oct Eylea OD. Patient states vision is stable and unchanged since last visit. Denies any new floaters or FOL. Pt states he has an appointment with regular eye doctor next week and will be getting a new glasses prescription. Timolol bid OD and latanoprost OU qhs.      Last edited by Laurin Coder on 06/30/2021  9:41 AM.      Referring physician: Sharyne Peach, MD [address] [place],  [place] [address]  HISTORICAL INFORMATION:   Selected notes from the MEDICAL RECORD NUMBER       CURRENT MEDICATIONS: Current Outpatient Medications (Ophthalmic Drugs)  Medication Sig   latanoprost (XALATAN) 0.005 % ophthalmic solution Place 1 drop into both eyes at bedtime.   timolol (TIMOPTIC) 0.5 % ophthalmic solution INSTILL 1 DROP IN RIGHT EYE TWICE DAILY   No current facility-administered medications for this visit. (Ophthalmic Drugs)   Current Outpatient Medications (Other)  Medication Sig   allopurinol (ZYLOPRIM) 100 MG tablet    allopurinol (ZYLOPRIM) 300 MG tablet    apixaban (ELIQUIS) 5 MG TABS tablet Take 5 mg by mouth daily.   aspirin 81 MG tablet Take 81 mg by mouth  daily.   atenolol (TENORMIN) 100 MG tablet Take 100 mg by mouth daily.   atorvastatin (LIPITOR) 40 MG tablet Take 40 mg by mouth daily.   diltiazem (TIAZAC) 300 MG 24 hr capsule Take 300 mg by mouth daily.   ezetimibe (ZETIA) 10 MG tablet Take 10 mg by mouth daily.   levothyroxine (SYNTHROID, LEVOTHROID) 50 MCG tablet Take 50 mcg by mouth daily before breakfast.   Multiple Vitamins-Minerals (MULTIVITAMIN PO) Take by mouth.   Omega-3 Fatty Acids (FISH OIL) 1000 MG CAPS Take by mouth 2 (two) times daily.   quinapril (ACCUPRIL) 40 MG tablet Take 40 mg by mouth at bedtime.   valsartan-hydrochlorothiazide (DIOVAN-HCT) 160-12.5 MG tablet TK 1 T PO QD   No current facility-administered medications for this visit. (Other)      REVIEW OF SYSTEMS:    ALLERGIES Allergies  Allergen Reactions   Brimonidine Itching, Dermatitis and Rash    PAST MEDICAL HISTORY Past Medical History:  Diagnosis Date   Cataract, nuclear sclerotic, left eye 11/14/2019   Glaucoma    Hyperlipidemia    Hypertension    Past Surgical History:  Procedure Laterality Date   ANGIOPLASTY     APPENDECTOMY     served nerve     hand 2014    FAMILY HISTORY Family History  Problem  Relation Age of Onset   Pneumonia Mother    Asthma Mother    Diverticulitis Mother    Glaucoma Mother    Congestive Heart Failure Father    CVA Father    Kidney failure Father    Glaucoma Father    Diverticulitis Father    Colon cancer Neg Hx    Pancreatic cancer Neg Hx    Stomach cancer Neg Hx     SOCIAL HISTORY Social History   Tobacco Use   Smoking status: Some Days   Smokeless tobacco: Never   Tobacco comments:    cigars  Substance Use Topics   Alcohol use: Yes   Drug use: No         OPHTHALMIC EXAM:  Base Eye Exam     Visual Acuity (ETDRS)       Right Left   Dist cc CF at 5' 20/20 -1   Dist ph cc NI     Correction: Glasses         Tonometry (Tonopen, 9:46 AM)       Right Left   Pressure 22 17          Pupils       Pupils Dark Light React APD   Right PERRL 4 4 Minimal None   Left PERRL 4 4 Minimal None         Extraocular Movement       Right Left    Full Full         Neuro/Psych     Oriented x3: Yes   Mood/Affect: Normal         Dilation     Both eyes: 1.0% Mydriacyl, 2.5% Phenylephrine @ 9:46 AM           Slit Lamp and Fundus Exam     External Exam       Right Left   External Normal Normal         Slit Lamp Exam       Right Left   Lids/Lashes Normal Normal   Conjunctiva/Sclera White and quiet White and quiet   Cornea Clear Clear   Anterior Chamber Deep and quiet Deep and quiet   Iris Round and reactive Round and reactive   Lens Centered posterior chamber intraocular lens Centered posterior chamber intraocular lens   Anterior Vitreous Normal Normal         Fundus Exam       Right Left   Posterior Vitreous Posterior vitreous detachment Normal   Disc 2+ Optic disc atrophy, 2+ Pallor, with collaterals now on the nerve, no NVD 1+ Pallor   C/D Ratio 0.99 0.9   Macula Retinal pigment epithelial mottling, no macular thickening, and hyperpigmentation in the fovea, no obvious CME Normal   Vessels Old central retinal vein occlusion of the right eye, no visible NVE Normal   Periphery Good moderate PRP laser scatter pattern Normal            IMAGING AND PROCEDURES  Imaging and Procedures for 06/30/21  Intravitreal Injection, Pharmacologic Agent - OD - Right Eye       Time Out 06/30/2021. 10:32 AM. Confirmed correct patient, procedure, site, and patient consented.   Anesthesia Topical anesthesia was used. Anesthetic medications included Akten 3.5%.   Procedure Preparation included 10% betadine to eyelids, Tobramycin 0.3%, 5% betadine to ocular surface. A 30 gauge needle was used.   Injection: 2 mg aflibercept 2 MG/0.05ML   Route: Intravitreal, Site: Right Eye  NDC: [id number], Lot: [id number], Waste: 0 mL    Post-op Post injection exam found visual acuity of at least counting fingers. The patient tolerated the procedure well. There were no complications. The patient received written and verbal post procedure care education. Post injection medications included ocuflox.      OCT, Retina - OU - Both Eyes       Right Eye Quality was good. Scan locations included subfoveal. Central Foveal Thickness: 212. Progression has improved. Findings include cystoid macular edema, abnormal foveal contour.   Left Eye Quality was borderline. Scan locations included subfoveal.   Notes CME has resolved from central retinal vein occlusion right eye now at 17 weeks follow-up.                   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  Today some 4 months post injection antivegF, Eylea with no recurrences of CME.  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 Craig Guess  Central retinal artery occlusion of right eye Accounts for acuity OD  Primary open angle glaucoma of left eye, moderate stage Follow-up Dr. Delight Stare as scheduled     ICD-10-CM   1. 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 Secondary glaucoma due to combination mechanisms, right, moderate stage  H40.51X2     3. Central retinal artery occlusion of right eye  H34.11     4. Primary open angle glaucoma of left eye, moderate stage  H40.1122       1.  OD, with history of ischemic CRV O complicated by CME now improving and stabilize even at 71-month follow-up.  Repeat injection Eylea OD today to  maintain.  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  Nonetheless I will retreat today and extend interval examination OD to 30-month  2.  OS with ongoing OAG, follow-up with Dr. Delman Cheadle as scheduled  3.  Ophthalmic Meds Ordered this visit:  Meds ordered this encounter  Medications   aflibercept (EYLEA) SOLN 2 mg       Return in about 5 months (around 11/28/2021) for DILATE OU, EYLEA OCT, OD.  There are no Patient Instructions on file for this visit.   Explained the diagnoses, plan, and follow up with the patient and they expressed understanding.  Patient expressed understanding of the importance of proper follow up care.   Clent Demark Atharv Barriere M.D. Diseases & Surgery of the Retina and Vitreous [facility] [place] 06/30/21     Abbreviations: M myopia (nearsighted); A astigmatism; H hyperopia (farsighted); P presbyopia; Mrx spectacle prescription;  CTL contact lenses; OD right eye; OS left eye; OU both eyes  XT exotropia; ET esotropia; PEK punctate epithelial keratitis; PEE punctate epithelial erosions; DES dry eye syndrome; MGD meibomian gland dysfunction; ATs artificial tears; PFAT's preservative free artificial tears; [place] nuclear sclerotic cataract; PSC posterior subcapsular cataract; ERM epi-retinal membrane; PVD posterior vitreous detachment; RD retinal  detachment; DM diabetes mellitus; DR diabetic retinopathy; NPDR non-proliferative diabetic retinopathy; PDR proliferative diabetic retinopathy; CSME clinically significant macular edema; DME diabetic macular edema; dbh dot blot hemorrhages; CWS cotton wool spot; POAG primary open angle glaucoma; C/D cup-to-disc ratio; HVF humphrey visual field; GVF goldmann visual field; OCT optical coherence tomography; IOP intraocular pressure; BRVO Branch retinal vein occlusion; CRVO central retinal  vein occlusion; CRAO central retinal artery occlusion; BRAO branch retinal artery occlusion; RT retinal tear; SB scleral buckle; PPV pars plana vitrectomy; VH Vitreous hemorrhage; PRP panretinal laser photocoagulation; IVK intravitreal kenalog; VMT vitreomacular traction; MH Macular hole;  NVD neovascularization of the disc; NVE neovascularization elsewhere; AREDS age related eye disease study; ARMD age related macular degeneration; POAG primary open angle glaucoma; EBMD epithelial/anterior basement membrane dystrophy; ACIOL anterior chamber intraocular lens; IOL intraocular lens; PCIOL posterior chamber intraocular lens; Phaco/IOL phacoemulsification with intraocular lens placement; [place] photorefractive keratectomy; LASIK laser assisted in situ keratomileusis; HTN hypertension; DM diabetes mellitus; COPD chronic obstructive pulmonary disease

## 2021-06-30 NOTE — Assessment & Plan Note (Signed)
Follow-up Dr. Delight Stare as scheduled

## 2021-06-30 NOTE — Assessment & Plan Note (Signed)
Ischemic CRV O, in the past with recurrences.  Today some 4 months post injection antivegF, Eylea with no recurrences of CME.  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

## 2021-07-08 DIAGNOSIS — H524 Presbyopia: Secondary | ICD-10-CM | POA: Diagnosis not present

## 2021-07-08 DIAGNOSIS — H5202 Hypermetropia, left eye: Secondary | ICD-10-CM | POA: Diagnosis not present

## 2021-07-08 DIAGNOSIS — Z961 Presence of intraocular lens: Secondary | ICD-10-CM | POA: Diagnosis not present

## 2021-07-08 DIAGNOSIS — H401132 Primary open-angle glaucoma, bilateral, moderate stage: Secondary | ICD-10-CM | POA: Diagnosis not present

## 2021-07-22 DIAGNOSIS — M10071 Idiopathic gout, right ankle and foot: Secondary | ICD-10-CM | POA: Diagnosis not present

## 2021-07-22 DIAGNOSIS — E785 Hyperlipidemia, unspecified: Secondary | ICD-10-CM | POA: Diagnosis not present

## 2021-07-22 DIAGNOSIS — E039 Hypothyroidism, unspecified: Secondary | ICD-10-CM | POA: Diagnosis not present

## 2021-07-22 DIAGNOSIS — I10 Essential (primary) hypertension: Secondary | ICD-10-CM | POA: Diagnosis not present

## 2021-07-22 DIAGNOSIS — Z125 Encounter for screening for malignant neoplasm of prostate: Secondary | ICD-10-CM | POA: Diagnosis not present

## 2021-07-29 DIAGNOSIS — D692 Other nonthrombocytopenic purpura: Secondary | ICD-10-CM | POA: Diagnosis not present

## 2021-07-29 DIAGNOSIS — Z0000 Encounter for general adult medical examination without abnormal findings: Secondary | ICD-10-CM | POA: Diagnosis not present

## 2021-07-29 DIAGNOSIS — Z1212 Encounter for screening for malignant neoplasm of rectum: Secondary | ICD-10-CM | POA: Diagnosis not present

## 2021-07-29 DIAGNOSIS — Z23 Encounter for immunization: Secondary | ICD-10-CM | POA: Diagnosis not present

## 2021-07-29 DIAGNOSIS — I10 Essential (primary) hypertension: Secondary | ICD-10-CM | POA: Diagnosis not present

## 2021-07-29 DIAGNOSIS — R82998 Other abnormal findings in urine: Secondary | ICD-10-CM | POA: Diagnosis not present

## 2021-07-29 DIAGNOSIS — I4891 Unspecified atrial fibrillation: Secondary | ICD-10-CM | POA: Diagnosis not present

## 2021-07-29 DIAGNOSIS — E785 Hyperlipidemia, unspecified: Secondary | ICD-10-CM | POA: Diagnosis not present

## 2021-07-29 DIAGNOSIS — E039 Hypothyroidism, unspecified: Secondary | ICD-10-CM | POA: Diagnosis not present

## 2021-07-29 DIAGNOSIS — I25118 Atherosclerotic heart disease of native coronary artery with other forms of angina pectoris: Secondary | ICD-10-CM | POA: Diagnosis not present

## 2021-10-09 DIAGNOSIS — Z961 Presence of intraocular lens: Secondary | ICD-10-CM | POA: Diagnosis not present

## 2021-10-09 DIAGNOSIS — H401132 Primary open-angle glaucoma, bilateral, moderate stage: Secondary | ICD-10-CM | POA: Diagnosis not present

## 2021-10-09 DIAGNOSIS — H5202 Hypermetropia, left eye: Secondary | ICD-10-CM | POA: Diagnosis not present

## 2021-11-13 DIAGNOSIS — I25118 Atherosclerotic heart disease of native coronary artery with other forms of angina pectoris: Secondary | ICD-10-CM | POA: Diagnosis not present

## 2021-11-13 DIAGNOSIS — E785 Hyperlipidemia, unspecified: Secondary | ICD-10-CM | POA: Diagnosis not present

## 2021-11-13 DIAGNOSIS — I4891 Unspecified atrial fibrillation: Secondary | ICD-10-CM | POA: Diagnosis not present

## 2021-11-13 DIAGNOSIS — I10 Essential (primary) hypertension: Secondary | ICD-10-CM | POA: Diagnosis not present

## 2021-12-01 ENCOUNTER — Ambulatory Visit (INDEPENDENT_AMBULATORY_CARE_PROVIDER_SITE_OTHER): Payer: Medicare PPO | Admitting: Ophthalmology

## 2021-12-01 ENCOUNTER — Encounter (INDEPENDENT_AMBULATORY_CARE_PROVIDER_SITE_OTHER): Payer: Self-pay | Admitting: Ophthalmology

## 2021-12-01 DIAGNOSIS — H3411 Central retinal artery occlusion, right eye: Secondary | ICD-10-CM | POA: Diagnosis not present

## 2021-12-01 DIAGNOSIS — H4051X3 Glaucoma secondary to other eye disorders, right eye, severe stage: Secondary | ICD-10-CM

## 2021-12-01 DIAGNOSIS — H34811 Central retinal vein occlusion, right eye, with macular edema: Secondary | ICD-10-CM

## 2021-12-01 NOTE — Assessment & Plan Note (Signed)
Now some 5 months post most recent injection OD.  Continued resolution of CME OD likely on the basis of diffuse retinal atrophy from [place] glaucomatous damage ? ?We will continue to monitor and observe but we will not treat with antivegF therapy today OD ?

## 2021-12-01 NOTE — Assessment & Plan Note (Signed)
Old in the past stable ?

## 2021-12-01 NOTE — Progress Notes (Signed)
? ? ?12/01/2021 ? ?  ? ?CHIEF COMPLAINT ?Patient presents for No chief complaint on file. ? ? ? ? ?HISTORY OF PRESENT ILLNESS: ?Ralph Harper is a 81 y.o. male who presents to the clinic today for:  ? ?HPI   ?5 mos fu Dilate OU, Eylea OD, OCT. ?Patient states he is not on Quinapril '40mg'$ , states there is a Producer, television/film/video, and it has been replaced by Tilmisartin but is unsure strength. Patient was on a combination pill but is now on only Hydrochlorothiazide pill. ?Patient is using Timolol OD BID and Latanoprost QHS OU. ?Patient allergy to brimonidine. ?Last edited by Laurin Coder on 12/01/2021  9:09 AM.  ?  ? ? ?Referring physician: ?Sharyne Peach, MD ?[address] ?[place],  [place] [address] ? ?HISTORICAL INFORMATION:  ? ?Selected notes from the [place] ?  ?   ? ?CURRENT MEDICATIONS: ?Current Outpatient Medications (Ophthalmic Drugs)  ?Medication Sig  ? latanoprost (XALATAN) 0.005 % ophthalmic solution Place 1 drop into both eyes at bedtime.  ? timolol (TIMOPTIC) 0.5 % ophthalmic solution INSTILL 1 DROP IN RIGHT EYE TWICE DAILY  ? ?No current facility-administered medications for this visit. (Ophthalmic Drugs)  ? ?Current Outpatient Medications (Other)  ?Medication Sig  ? allopurinol (ZYLOPRIM) 100 MG tablet   ? allopurinol (ZYLOPRIM) 300 MG tablet   ? apixaban (ELIQUIS) 5 MG TABS tablet Take 5 mg by mouth daily.  ? aspirin 81 MG tablet Take 81 mg by mouth daily.  ? atenolol (TENORMIN) 100 MG tablet Take 100 mg by mouth daily.  ? atorvastatin (LIPITOR) 40 MG tablet Take 40 mg by mouth daily.  ? diltiazem (TIAZAC) 300 MG 24 hr capsule Take 300 mg by mouth daily.  ? ezetimibe (ZETIA) 10 MG tablet Take 10 mg by mouth daily.  ? levothyroxine (SYNTHROID, LEVOTHROID) 50 MCG tablet Take 50 mcg by mouth daily before breakfast.  ? Multiple Vitamins-Minerals (MULTIVITAMIN PO) Take by mouth.  ? Omega-3 Fatty Acids (FISH OIL) 1000 MG CAPS Take by mouth 2 (two) times daily.  ? quinapril (ACCUPRIL) 40 MG tablet Take  40 mg by mouth at bedtime.  ? valsartan-hydrochlorothiazide (DIOVAN-HCT) 160-12.5 MG tablet TK 1 T PO QD  ? ?No current facility-administered medications for this visit. (Other)  ? ? ? ? ?REVIEW OF SYSTEMS: ?ROS   ?Negative for: Constitutional, Gastrointestinal, Neurological, Skin, Genitourinary, Musculoskeletal, HENT, Endocrine, Cardiovascular, Eyes, Respiratory, Psychiatric, Allergic/Imm, Heme/Lymph ?Last edited by Hurman Horn, MD on 12/01/2021 10:09 AM.  ?  ? ? ? ?ALLERGIES ?Allergies  ?Allergen Reactions  ? Brimonidine Itching, Dermatitis and Rash  ? ? ?PAST MEDICAL HISTORY ?Past Medical History:  ?Diagnosis Date  ? Cataract, nuclear sclerotic, left eye 11/14/2019  ? Glaucoma   ? Hyperlipidemia   ? Hypertension   ? ?Past Surgical History:  ?Procedure Laterality Date  ? ANGIOPLASTY    ? APPENDECTOMY    ? served nerve    ? hand 2014  ? ? ?FAMILY HISTORY ?Family History  ?Problem Relation Age of Onset  ? Pneumonia Mother   ? Asthma Mother   ? Diverticulitis Mother   ? Glaucoma Mother   ? Congestive Heart Failure Father   ? CVA Father   ? Kidney failure Father   ? Glaucoma Father   ? Diverticulitis Father   ? Colon cancer Neg Hx   ? Pancreatic cancer Neg Hx   ? Stomach cancer Neg Hx   ? ? ?SOCIAL HISTORY ?Social History  ? ?Tobacco Use  ?  Smoking status: Some Days  ? Smokeless tobacco: Never  ? Tobacco comments:  ?  cigars  ?Substance Use Topics  ? Alcohol use: Yes  ? Drug use: No  ? ?  ? ?  ? ?OPHTHALMIC EXAM: ? ?Base Eye Exam   ? ? Visual Acuity (ETDRS)   ? ?   Right Left  ? Dist cc CF at 5' 20/25  ? Dist ph cc NI   ? ? Correction: Glasses  ? ?  ?  ? ? Tonometry (Tonopen, 9:12 AM)   ? ?   Right Left  ? Pressure 13 12  ? ?  ?  ? ? Pupils   ? ?   Pupils Dark Light APD  ? Right PERRL 3 3 None  ? Left PERRL 3 3 None  ? ?  ?  ? ? Visual Fields (Counting fingers)   ? ?   Left Right  ?  Full   ? Restrictions  Partial outer superior temporal, inferior temporal, superior nasal, inferior nasal deficiencies  ? ?  ?  ? ?  Extraocular Movement   ? ?   Right Left  ?  Full Full  ? ?  ?  ? ? Neuro/Psych   ? ? Oriented x3: Yes  ? Mood/Affect: Normal  ? ?  ?  ? ? Dilation   ? ? Both eyes: 1.0% Mydriacyl, 2.5% Phenylephrine @ 9:12 AM  ? ?  ?  ? ?  ? ?Slit Lamp and Fundus Exam   ? ? External Exam   ? ?   Right Left  ? External Normal Normal  ? ?  ?  ? ? Slit Lamp Exam   ? ?   Right Left  ? Lids/Lashes Normal Normal  ? Conjunctiva/Sclera White and quiet White and quiet  ? Cornea Clear Clear  ? Anterior Chamber Deep and quiet Deep and quiet  ? Iris Round and reactive Round and reactive  ? Lens Centered posterior chamber intraocular lens Centered posterior chamber intraocular lens  ? Anterior Vitreous Normal Normal  ? ?  ?  ? ? Fundus Exam   ? ?   Right Left  ? Posterior Vitreous Posterior vitreous detachment Normal  ? Disc 2+ Optic disc atrophy, 2+ Pallor, with collaterals now on the nerve, no NVD 2+ Pallor, Thin rim  ? C/D Ratio 0.99 0.9  ? Macula Retinal pigment epithelial mottling, no macular thickening, and hyperpigmentation in the fovea, no obvious CME Normal  ? Vessels Old central retinal vein occlusion of the right eye, no visible NVE Normal  ? Periphery Good moderate PRP laser scatter pattern Normal  ? ?  ?  ? ?  ? ? ?IMAGING AND PROCEDURES  ?Imaging and Procedures for 12/01/21 ? ?OCT, Retina - OU - Both Eyes   ? ?   ?Right Eye ?Quality was good. Scan locations included subfoveal. Central Foveal Thickness: 204. Progression has improved. Findings include cystoid macular edema, abnormal foveal contour.  ? ?Left Eye ?Quality was borderline. Scan locations included subfoveal. Central Foveal Thickness: 258.  ? ?Notes ?CME has resolved from central retinal vein occlusion right eye now at 5 month f/u  ? ? ? ? ? ?  ? ? ?  ?  ? ?  ?ASSESSMENT/PLAN: ? ?Secondary glaucoma due to combination mechanisms, right, severe stage ?Continues under the care of Dr. Delman Cheadle follow-up as scheduled ? ?Central retinal vein occlusion with macular edema of right  eye ?Now some 5 months post most recent  injection OD.  Continued resolution of CME OD likely on the basis of diffuse retinal atrophy from [place] glaucomatous damage ? ?We will continue to monitor and observe but we will not treat with antivegF therapy today OD ? ?Central retinal artery occlusion of right eye ?Old in the past stable  ? ?  ICD-10-CM   ?1. 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  CANCELED: Intravitreal Injection, Pharmacologic Agent - OD - Right Eye  ?  ?2. Secondary glaucoma due to combination mechanisms, right, severe stage  H40.51X3   ?  ?3. Central retinal artery occlusion of right eye  H34.11   ?  ? ? ?1.  OD, old CRVO with CME centrally, now at 54-monthfollow-up today in stable condition.  No recurrence of CME thus of therapeutic effect for the last 3 months.  We will discuss with patient and of opted to not treat today we will continue to follow closely.  Follow-up in 3 months reevaluate and if no recurrence of CME we will lengthen the follow-up interval thereafter to 6 months ? ?2.  Advanced glaucomatous damage OU under the care of Dr. GHelane Rima  Follow-up Dr. GDelman Cheadleas scheduled ? ?3. ? ?Ophthalmic Meds Ordered this visit:  ?No orders of the defined types were placed in this encounter. ? ? ?  ? ?Return in about 4 months (around 04/02/2022) for DILATE OU, COLOR FP, OCT. ? ?There are no Patient Instructions on file for this visit. ? ? ?Explained the diagnoses, plan, and follow up with the patient and they expressed understanding.  Patient expressed understanding of the importance of proper follow up care.  ? ?GClent Demark Sylis Ketchum M.D. ?Diseases & Surgery of the Retina and Vitreous ?[facility]?12/01/21 ? ? ? ? ?Abbreviations: ?M myopia (nearsighted); A astigmatism; H hyperopia (farsighted); P presbyopia; Mrx spectacle prescription;  CTL contact lenses; OD right eye; OS left eye; OU both eyes  XT exotropia; ET esotropia; PEK  punctate epithelial keratitis; PEE punctate epithelial erosions; DES dry eye syndrome; MGD meibomian gland dysfunction; ATs artificial tears; PFAT's preservative free artificial tears; NOrocovisnuclear sclerotic

## 2021-12-01 NOTE — Assessment & Plan Note (Signed)
Continues under the care of Dr. Delman Cheadle follow-up as scheduled ?

## 2022-02-25 DIAGNOSIS — H02831 Dermatochalasis of right upper eyelid: Secondary | ICD-10-CM | POA: Diagnosis not present

## 2022-02-25 DIAGNOSIS — H401132 Primary open-angle glaucoma, bilateral, moderate stage: Secondary | ICD-10-CM | POA: Diagnosis not present

## 2022-02-25 DIAGNOSIS — H353121 Nonexudative age-related macular degeneration, left eye, early dry stage: Secondary | ICD-10-CM | POA: Diagnosis not present

## 2022-02-25 DIAGNOSIS — H4720 Unspecified optic atrophy: Secondary | ICD-10-CM | POA: Diagnosis not present

## 2022-03-26 ENCOUNTER — Ambulatory Visit (INDEPENDENT_AMBULATORY_CARE_PROVIDER_SITE_OTHER): Payer: Medicare PPO | Admitting: Ophthalmology

## 2022-03-26 ENCOUNTER — Encounter (INDEPENDENT_AMBULATORY_CARE_PROVIDER_SITE_OTHER): Payer: Self-pay | Admitting: Ophthalmology

## 2022-03-26 DIAGNOSIS — H34811 Central retinal vein occlusion, right eye, with macular edema: Secondary | ICD-10-CM | POA: Diagnosis not present

## 2022-03-26 DIAGNOSIS — H4051X3 Glaucoma secondary to other eye disorders, right eye, severe stage: Secondary | ICD-10-CM | POA: Diagnosis not present

## 2022-03-26 DIAGNOSIS — H348111 Central retinal vein occlusion, right eye, with retinal neovascularization: Secondary | ICD-10-CM

## 2022-03-26 DIAGNOSIS — H401122 Primary open-angle glaucoma, left eye, moderate stage: Secondary | ICD-10-CM | POA: Diagnosis not present

## 2022-03-26 DIAGNOSIS — H348112 Central retinal vein occlusion, right eye, stable: Secondary | ICD-10-CM

## 2022-03-26 NOTE — Patient Instructions (Signed)
Follow-up Dr. Sharyne Peach as scheduled or [facility].

## 2022-03-26 NOTE — Assessment & Plan Note (Signed)
Under the care of Dr. Melissa Noon

## 2022-03-26 NOTE — Assessment & Plan Note (Signed)
Continue under the care of Dr. Melissa Noon

## 2022-03-26 NOTE — Assessment & Plan Note (Signed)
Not active at this time

## 2022-03-26 NOTE — Progress Notes (Signed)
03/26/2022     CHIEF COMPLAINT Patient presents for  Chief Complaint  Patient presents with   Central Retinal Vein Occlusion      HISTORY OF PRESENT ILLNESS: Ralph Harper is a 81 y.o. male who presents to the clinic today for:   HPI   4 mths dilate ou color fp oct Pt states his vision has been stable Pt denies any new floaters or FOL Pt states he has a hard time seeing up close Last edited by Morene Rankins, CMA on 03/26/2022  8:49 AM.      Referring physician: Prince Solian, MD [place],  [place] [address]  HISTORICAL INFORMATION:   Selected notes from the [facility]: Current Outpatient Medications (Ophthalmic Drugs)  Medication Sig   latanoprost (XALATAN) 0.005 % ophthalmic solution Place 1 drop into both eyes at bedtime.   timolol (TIMOPTIC) 0.5 % ophthalmic solution INSTILL 1 DROP IN RIGHT EYE TWICE DAILY   No current facility-administered medications for this visit. (Ophthalmic Drugs)   Current Outpatient Medications (Other)  Medication Sig   allopurinol (ZYLOPRIM) 100 MG tablet    allopurinol (ZYLOPRIM) 300 MG tablet    apixaban (ELIQUIS) 5 MG TABS tablet Take 5 mg by mouth daily.   aspirin 81 MG tablet Take 81 mg by mouth daily.   atenolol (TENORMIN) 100 MG tablet Take 100 mg by mouth daily.   atorvastatin (LIPITOR) 40 MG tablet Take 40 mg by mouth daily.   diltiazem (TIAZAC) 300 MG 24 hr capsule Take 300 mg by mouth daily.   ezetimibe (ZETIA) 10 MG tablet Take 10 mg by mouth daily.   levothyroxine (SYNTHROID, LEVOTHROID) 50 MCG tablet Take 50 mcg by mouth daily before breakfast.   Multiple Vitamins-Minerals (MULTIVITAMIN PO) Take by mouth.   Omega-3 Fatty Acids (FISH OIL) 1000 MG CAPS Take by mouth 2 (two) times daily.   quinapril (ACCUPRIL) 40 MG tablet Take 40 mg by mouth at bedtime.   valsartan-hydrochlorothiazide (DIOVAN-HCT) 160-12.5 MG tablet TK 1 T PO QD   No current facility-administered  medications for this visit. (Other)      REVIEW OF SYSTEMS: ROS   Negative for: Constitutional, Gastrointestinal, Neurological, Skin, Genitourinary, Musculoskeletal, HENT, Endocrine, Cardiovascular, Eyes, Respiratory, Psychiatric, Allergic/Imm, Heme/Lymph Last edited by Orene Desanctis D, CMA on 03/26/2022  8:49 AM.       ALLERGIES Allergies  Allergen Reactions   Brimonidine Itching, Dermatitis and Rash    PAST MEDICAL HISTORY Past Medical History:  Diagnosis Date   Cataract, nuclear sclerotic, left eye 11/14/2019   Glaucoma    Hyperlipidemia    Hypertension    Past Surgical History:  Procedure Laterality Date   ANGIOPLASTY     APPENDECTOMY     served nerve     hand 2014    FAMILY HISTORY Family History  Problem Relation Age of Onset   Pneumonia Mother    Asthma Mother    Diverticulitis Mother    Glaucoma Mother    Congestive Heart Failure Father    CVA Father    Kidney failure Father    Glaucoma Father    Diverticulitis Father    Colon cancer Neg Hx    Pancreatic cancer Neg Hx    Stomach cancer Neg Hx     SOCIAL HISTORY Social History   Tobacco Use   Smoking status: Some Days   Smokeless tobacco: Never   Tobacco comments:    cigars  Substance  Use Topics   Alcohol use: Yes   Drug use: No         OPHTHALMIC EXAM:  Base Eye Exam     Visual Acuity (ETDRS)       Right Left   Dist [place] CF at 5' 20/20    Correction: Glasses         Tonometry (Tonopen, 8:53 AM)       Right Left   Pressure 18 16         Neuro/Psych     Oriented x3: Yes   Mood/Affect: Normal         Dilation     Both eyes: 1.0% Mydriacyl, 2.5% Phenylephrine @ 8:50 AM           Slit Lamp and Fundus Exam     External Exam       Right Left   External Normal Normal         Slit Lamp Exam       Right Left   Lids/Lashes Normal Normal   Conjunctiva/Sclera White and quiet White and quiet   Cornea Clear Clear   Anterior Chamber Deep and quiet Deep and  quiet   Iris Round and reactive Round and reactive   Lens Centered posterior chamber intraocular lens Centered posterior chamber intraocular lens   Anterior Vitreous Normal Normal         Fundus Exam       Right Left   Posterior Vitreous Posterior vitreous detachment Normal   Disc 2+ Optic disc atrophy, 2+ Pallor, with collaterals now on the nerve, no NVD 2+ Pallor, Thin rim   C/D Ratio 0.99 0.9   Macula Retinal pigment epithelial mottling, no macular thickening, and hyperpigmentation in the fovea, no obvious CME Normal   Vessels Old central retinal vein occlusion of the right eye, no visible NVE Normal   Periphery Good moderate PRP laser scatter pattern Normal            IMAGING AND PROCEDURES  Imaging and Procedures for 03/26/22  OCT, Retina - OU - Both Eyes       Right Eye Quality was good. Scan locations included subfoveal. Central Foveal Thickness: 200. Progression has improved. Findings include abnormal foveal contour, cystoid macular edema.   Left Eye Quality was borderline. Scan locations included subfoveal. Central Foveal Thickness: 258. Progression has been stable.   Notes CME has resolved from central retinal vein occlusion right eye now at 8 month f/u, and most recent injection prior 8 weeks.  Doing well          Color Fundus Photography Optos - OU - Both Eyes       Right Eye Progression has been stable. Disc findings include increased cup to disc ratio, pallor, thinning of rim. Vessels : tortuous vessels.   Left Eye Progression has been stable. Disc findings include increased cup to disc ratio, thinning of rim.   Notes Old CRV O with attenuated vessels, and collaterals on the nerve  No sign of Recurrent CME nor retinal hemorrhages in the right eye.  Anterior maculopathy OD remains              ASSESSMENT/PLAN:  Secondary glaucoma due to combination mechanisms, right, severe stage Continue under the care of Dr. 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  No diffuse retinal hemorrhages remain.  No CME remains.  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 Melissa Noon     ICD-10-CM   1. 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 Secondary glaucoma due to combination mechanisms, right, severe stage  H40.51X3     3. Stable central retinal vein occlusion of right eye  H34.8112     4. 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 Primary open angle glaucoma of left eye, moderate stage  H40.1122       1.  OD doing very well.  Collateralized optic nerve.  No active CRVO findings.  2.  Rather advanced glaucoma OU, follow-up Dr. Melissa Noon as scheduled yes  3.  Ophthalmic Meds Ordered this visit:  No orders of the defined types were placed in this encounter.      Return in about 1 year (around 03/27/2023) for DILATE OU, COLOR FP, OCT.  Patient Instructions  Follow-up Dr. Sharyne Peach as scheduled or [facility].   Explained the diagnoses, plan, and follow up with the patient and they expressed understanding.  Patient expressed understanding of the importance of proper follow up care.   Clent Demark Arohi Salvatierra M.D. Diseases & Surgery of the Retina and Vitreous [facility] [place] 03/26/22     Abbreviations: M myopia (nearsighted); A astigmatism; H hyperopia (farsighted); P presbyopia; Mrx spectacle prescription;  CTL contact lenses; OD right eye; OS left eye; OU both eyes  XT exotropia; ET esotropia; PEK punctate epithelial keratitis; PEE punctate epithelial erosions; DES dry eye syndrome; MGD meibomian gland  dysfunction; ATs artificial tears; PFAT's preservative free artificial tears; [place] nuclear sclerotic cataract; PSC posterior subcapsular cataract; ERM epi-retinal membrane; PVD posterior vitreous detachment; RD retinal detachment; DM diabetes mellitus; DR diabetic retinopathy; NPDR non-proliferative diabetic retinopathy; PDR proliferative diabetic retinopathy; CSME clinically significant macular edema; DME diabetic macular edema; dbh dot blot hemorrhages; CWS cotton wool spot; POAG primary open angle glaucoma; C/D cup-to-disc ratio; HVF humphrey visual field; GVF goldmann visual field; OCT optical coherence tomography; IOP intraocular pressure; BRVO Branch retinal vein occlusion; CRVO central retinal vein occlusion; CRAO central retinal artery occlusion; BRAO branch retinal artery occlusion; RT retinal tear; SB scleral buckle; PPV pars plana vitrectomy; VH Vitreous hemorrhage; PRP panretinal laser photocoagulation; IVK intravitreal kenalog; VMT vitreomacular traction; MH Macular hole;  NVD neovascularization of the disc; NVE neovascularization elsewhere; AREDS age related eye disease study; ARMD age related macular degeneration; POAG primary open angle glaucoma; EBMD epithelial/anterior basement membrane dystrophy; ACIOL anterior chamber intraocular lens; IOL intraocular lens; PCIOL posterior chamber intraocular lens; Phaco/IOL phacoemulsification with intraocular lens placement; [place] photorefractive keratectomy; LASIK laser assisted in situ keratomileusis; HTN hypertension; DM diabetes mellitus; COPD chronic obstructive pulmonary disease

## 2022-03-26 NOTE — Assessment & Plan Note (Signed)
OD now collateralized optic nerve.  No diffuse retinal hemorrhages remain.  No CME remains.  We will continue to observe

## 2022-04-02 ENCOUNTER — Encounter (INDEPENDENT_AMBULATORY_CARE_PROVIDER_SITE_OTHER): Payer: Medicare PPO | Admitting: Ophthalmology

## 2022-07-06 DIAGNOSIS — H401132 Primary open-angle glaucoma, bilateral, moderate stage: Secondary | ICD-10-CM | POA: Diagnosis not present

## 2022-11-03 ENCOUNTER — Emergency Department (HOSPITAL_BASED_OUTPATIENT_CLINIC_OR_DEPARTMENT_OTHER)
Admission: EM | Admit: 2022-11-03 | Discharge: 2022-11-03 | Disposition: A | Discharge status: Home / Self Care | Payer: Medicare PPO | Attending: Emergency Medicine | Admitting: Emergency Medicine

## 2022-11-03 ENCOUNTER — Emergency Department (HOSPITAL_BASED_OUTPATIENT_CLINIC_OR_DEPARTMENT_OTHER): Payer: Medicare PPO

## 2022-11-03 ENCOUNTER — Encounter (HOSPITAL_BASED_OUTPATIENT_CLINIC_OR_DEPARTMENT_OTHER): Payer: Self-pay | Admitting: Emergency Medicine

## 2022-11-03 ENCOUNTER — Other Ambulatory Visit: Payer: Self-pay

## 2022-11-03 DIAGNOSIS — W19XXXA Unspecified fall, initial encounter: Secondary | ICD-10-CM | POA: Diagnosis not present

## 2022-11-03 DIAGNOSIS — Z7901 Long term (current) use of anticoagulants: Secondary | ICD-10-CM | POA: Insufficient documentation

## 2022-11-03 DIAGNOSIS — S7012XA Contusion of left thigh, initial encounter: Secondary | ICD-10-CM | POA: Insufficient documentation

## 2022-11-03 DIAGNOSIS — X58XXXA Exposure to other specified factors, initial encounter: Secondary | ICD-10-CM | POA: Insufficient documentation

## 2022-11-03 DIAGNOSIS — M25552 Pain in left hip: Secondary | ICD-10-CM | POA: Diagnosis not present

## 2022-11-03 DIAGNOSIS — S70312A Abrasion, left thigh, initial encounter: Secondary | ICD-10-CM | POA: Diagnosis not present

## 2022-11-03 NOTE — ED Provider Notes (Signed)
[facility] Provider Note   CSN: [id number] Arrival date & time: 11/03/22  [id number]     History  Chief Complaint  Patient presents with   Groin Pain    Ralph Harper is a 82 y.o. male.  HPI 82 year old male who is on Eliquis for A-fib presents with a left proximal thigh bruise.  He states that he woke up and had a significant bruise to his proximal thigh near his groin.  When asked, he does note that he fell onto the ground though it was mild and he basically sat down hard.  He has a little mild discomfort but nothing significant.  Has been ambulatory with his walking stick, which is baseline.  He denies any weakness or numbness, lightheadedness or dizziness.  He went to urgent care and due to the extent of bruising they sent him here.  No other leg swelling.  Home Medications Prior to Admission medications   Medication Sig Start Date End Date Taking? Authorizing Provider  allopurinol (ZYLOPRIM) 100 MG tablet  08/30/13   [provider]  allopurinol (ZYLOPRIM) 300 MG tablet  10/31/19   [provider]  apixaban (ELIQUIS) 5 MG TABS tablet Take 5 mg by mouth daily.    [provider]  aspirin 81 MG tablet Take 81 mg by mouth daily.    [provider]  atenolol (TENORMIN) 100 MG tablet Take 100 mg by mouth daily.    [provider]  atorvastatin (LIPITOR) 40 MG tablet Take 40 mg by mouth daily.    [provider]  diltiazem (TIAZAC) 300 MG 24 hr capsule Take 300 mg by mouth daily.    [provider]  ezetimibe (ZETIA) 10 MG tablet Take 10 mg by mouth daily.    [provider]  latanoprost (XALATAN) 0.005 % ophthalmic solution Place 1 drop into both eyes at bedtime.    [provider]  levothyroxine (SYNTHROID, LEVOTHROID) 50 MCG tablet Take 50 mcg by mouth daily before breakfast.    [provider]  Multiple Vitamins-Minerals (MULTIVITAMIN PO) Take by mouth.     [provider]  Omega-3 Fatty Acids (FISH OIL) 1000 MG CAPS Take by mouth 2 (two) times daily.    [provider]  quinapril (ACCUPRIL) 40 MG tablet Take 40 mg by mouth at bedtime.    [provider]  timolol (TIMOPTIC) 0.5 % ophthalmic solution INSTILL 1 DROP IN RIGHT EYE TWICE DAILY 04/07/21   Rankin, Clent Demark, MD  valsartan-hydrochlorothiazide (DIOVAN-HCT) 160-12.5 MG tablet TK 1 T PO QD 06/02/18   [provider]      Allergies    Brimonidine    Review of Systems   Review of Systems  Respiratory:  Negative for shortness of breath.   Genitourinary:  Negative for difficulty urinating, hematuria, scrotal swelling and testicular pain.  Musculoskeletal:  Negative for arthralgias.  Skin:  Positive for color change.  Neurological:  Negative for dizziness, weakness, light-headedness and numbness.    Physical Exam Updated Vital Signs BP (!) 151/107   Pulse 68   Temp 98.1 F (36.7 C) (Oral)   Resp 18   Ht 5' 9.5" (1.765 m)   Wt 117 kg   SpO2 95%   BMI 37.54 kg/m  Physical Exam Vitals and nursing note reviewed.  Constitutional:      Appearance: He is well-developed.  HENT:     Head: Normocephalic and atraumatic.  Cardiovascular:     Rate and Rhythm:  Normal rate and regular rhythm.     Pulses:          Dorsalis pedis pulses are 2+ on the left side.  Pulmonary:     Effort: Pulmonary effort is normal.  Abdominal:     General: There is no distension.  Musculoskeletal:     Left upper leg: No deformity or tenderness.       Legs:     Comments: Besides the area of ecchymosis, there is no thigh or calf swelling or tenderness.  Skin:    General: Skin is warm and dry.  Neurological:     Mental Status: He is alert.     ED Results / Procedures / Treatments   Labs (all labs ordered are listed, but only abnormal results are displayed) Labs Reviewed - No data to display  EKG None  Radiology DG Hip Unilat W or Wo Pelvis 2-3 Views  Left  Result Date: 11/03/2022 CLINICAL DATA:  Pain after fall EXAM: DG HIP (WITH OR WITHOUT PELVIS) 3V LEFT COMPARISON:  None Available. FINDINGS: No acute fracture or dislocation. Preserved joint spaces. Only mild sclerosis along the right sacroiliac joint greater than left. Hyperostosis. Vascular calcifications. Degenerative changes of the lumbar spine. Bridging osteophytes. IMPRESSION: Degenerative changes seen particularly of the sacroiliac joints. Electronically Signed   By: Jill Side M.D.   On: 11/03/2022 18:19    Procedures Procedures    Medications Ordered in ED Medications - No data to display  ED Course/ Medical Decision Making/ A&P                             Medical Decision Making Amount and/or Complexity of Data Reviewed External Data Reviewed: notes. Radiology: ordered and independent interpretation performed.    Details: No pelvic fracture/hip fracture   Patient appears have a significant bruise though he has been bearing weight at home and has no significant tenderness.  Given this seems to be traumatic with a mild fall and abrasions from yesterday, I doubt spontaneous bleeding/bruising and doubt deep space emergency.  Doubt DVT.  X-ray was unrevealing.  He feels pretty good and has no systemic symptoms that would suggest blood loss.  The ecchymosis has not enlarged or gotten worse since onset.  Given this, I think supportive care and expectant management is most prudent and he can follow-up with PCP.  Will give return precautions.        Final Clinical Impression(s) / ED Diagnoses Final diagnoses:  Traumatic ecchymosis of left thigh, initial encounter    Rx / DC Orders ED Discharge Orders     None         Sherwood Gambler, MD 11/03/22 1901

## 2022-11-03 NOTE — ED Triage Notes (Signed)
Pt arrives pov, to triage in wheelchair, reports LT side groin bruising, was referred by "doc in the box". Pt endorses thinners. Unsure if injury, reports "crawling on floor, after letting myself down".

## 2022-11-03 NOTE — Discharge Instructions (Addendum)
Be sure to apply ice to the bruise and you can also elevate your leg.  If you feel like there is new or worsening pain, the bruise is getting bigger, or he develop any other new/concerning symptoms then return to the ER or call 911.

## 2022-11-04 ENCOUNTER — Telehealth: Payer: Self-pay

## 2022-11-04 NOTE — Telephone Encounter (Signed)
     Patient  visit on 3/26  at [facility]    Have you been able to follow up with your primary care physician? Yes   The patient was or was not able to obtain any needed medicine or equipment. Yes   Are there diet recommendations that you are having difficulty following? Na   Patient expresses understanding of discharge instructions and education provided has no other needs at this time.  Yes      [facility] [phone] [address], [place], [place] [address] Phone: [phone] Email: [email or web address]

## 2022-12-23 DIAGNOSIS — Z961 Presence of intraocular lens: Secondary | ICD-10-CM | POA: Diagnosis not present

## 2022-12-23 DIAGNOSIS — H401133 Primary open-angle glaucoma, bilateral, severe stage: Secondary | ICD-10-CM | POA: Diagnosis not present

## 2023-03-29 ENCOUNTER — Encounter (INDEPENDENT_AMBULATORY_CARE_PROVIDER_SITE_OTHER): Payer: Medicare PPO | Admitting: Ophthalmology

## 2023-06-29 DIAGNOSIS — Z1212 Encounter for screening for malignant neoplasm of rectum: Secondary | ICD-10-CM | POA: Diagnosis not present

## 2023-06-29 DIAGNOSIS — E785 Hyperlipidemia, unspecified: Secondary | ICD-10-CM | POA: Diagnosis not present

## 2023-06-30 DIAGNOSIS — E039 Hypothyroidism, unspecified: Secondary | ICD-10-CM | POA: Diagnosis not present

## 2023-06-30 DIAGNOSIS — I10 Essential (primary) hypertension: Secondary | ICD-10-CM | POA: Diagnosis not present

## 2023-06-30 DIAGNOSIS — E785 Hyperlipidemia, unspecified: Secondary | ICD-10-CM | POA: Diagnosis not present

## 2023-06-30 DIAGNOSIS — Z125 Encounter for screening for malignant neoplasm of prostate: Secondary | ICD-10-CM | POA: Diagnosis not present

## 2023-07-01 DIAGNOSIS — H35351 Cystoid macular degeneration, right eye: Secondary | ICD-10-CM | POA: Diagnosis not present

## 2023-07-01 DIAGNOSIS — H348112 Central retinal vein occlusion, right eye, stable: Secondary | ICD-10-CM | POA: Diagnosis not present

## 2023-07-01 DIAGNOSIS — H4051X0 Glaucoma secondary to other eye disorders, right eye, stage unspecified: Secondary | ICD-10-CM | POA: Diagnosis not present

## 2023-07-01 DIAGNOSIS — H401123 Primary open-angle glaucoma, left eye, severe stage: Secondary | ICD-10-CM | POA: Diagnosis not present

## 2023-07-01 DIAGNOSIS — H3411 Central retinal artery occlusion, right eye: Secondary | ICD-10-CM | POA: Diagnosis not present

## 2023-07-06 DIAGNOSIS — M10071 Idiopathic gout, right ankle and foot: Secondary | ICD-10-CM | POA: Diagnosis not present

## 2023-07-06 DIAGNOSIS — Z7901 Long term (current) use of anticoagulants: Secondary | ICD-10-CM | POA: Diagnosis not present

## 2023-07-06 DIAGNOSIS — Z0000 Encounter for general adult medical examination without abnormal findings: Secondary | ICD-10-CM | POA: Diagnosis not present

## 2023-07-06 DIAGNOSIS — I4891 Unspecified atrial fibrillation: Secondary | ICD-10-CM | POA: Diagnosis not present

## 2023-07-06 DIAGNOSIS — Z23 Encounter for immunization: Secondary | ICD-10-CM | POA: Diagnosis not present

## 2023-07-06 DIAGNOSIS — I10 Essential (primary) hypertension: Secondary | ICD-10-CM | POA: Diagnosis not present

## 2023-07-06 DIAGNOSIS — Z1331 Encounter for screening for depression: Secondary | ICD-10-CM | POA: Diagnosis not present

## 2023-07-06 DIAGNOSIS — Z1339 Encounter for screening examination for other mental health and behavioral disorders: Secondary | ICD-10-CM | POA: Diagnosis not present

## 2023-07-06 DIAGNOSIS — R82998 Other abnormal findings in urine: Secondary | ICD-10-CM | POA: Diagnosis not present

## 2023-07-06 DIAGNOSIS — E039 Hypothyroidism, unspecified: Secondary | ICD-10-CM | POA: Diagnosis not present

## 2023-07-06 DIAGNOSIS — I25118 Atherosclerotic heart disease of native coronary artery with other forms of angina pectoris: Secondary | ICD-10-CM | POA: Diagnosis not present

## 2023-07-06 DIAGNOSIS — R972 Elevated prostate specific antigen [PSA]: Secondary | ICD-10-CM | POA: Diagnosis not present

## 2023-07-06 DIAGNOSIS — D692 Other nonthrombocytopenic purpura: Secondary | ICD-10-CM | POA: Diagnosis not present

## 2023-07-19 DIAGNOSIS — R801 Persistent proteinuria, unspecified: Secondary | ICD-10-CM | POA: Diagnosis not present

## 2023-12-22 DIAGNOSIS — Z961 Presence of intraocular lens: Secondary | ICD-10-CM | POA: Diagnosis not present

## 2023-12-22 DIAGNOSIS — H5202 Hypermetropia, left eye: Secondary | ICD-10-CM | POA: Diagnosis not present

## 2023-12-22 DIAGNOSIS — H401133 Primary open-angle glaucoma, bilateral, severe stage: Secondary | ICD-10-CM | POA: Diagnosis not present

## 2024-01-06 DIAGNOSIS — R801 Persistent proteinuria, unspecified: Secondary | ICD-10-CM | POA: Diagnosis not present

## 2024-01-06 DIAGNOSIS — F1021 Alcohol dependence, in remission: Secondary | ICD-10-CM | POA: Diagnosis not present

## 2024-01-06 DIAGNOSIS — R972 Elevated prostate specific antigen [PSA]: Secondary | ICD-10-CM | POA: Diagnosis not present

## 2024-01-06 DIAGNOSIS — D751 Secondary polycythemia: Secondary | ICD-10-CM | POA: Diagnosis not present

## 2024-01-06 DIAGNOSIS — I4891 Unspecified atrial fibrillation: Secondary | ICD-10-CM | POA: Diagnosis not present

## 2024-01-06 DIAGNOSIS — E039 Hypothyroidism, unspecified: Secondary | ICD-10-CM | POA: Diagnosis not present

## 2024-01-06 DIAGNOSIS — I10 Essential (primary) hypertension: Secondary | ICD-10-CM | POA: Diagnosis not present

## 2024-01-19 DIAGNOSIS — E039 Hypothyroidism, unspecified: Secondary | ICD-10-CM | POA: Diagnosis not present

## 2024-02-29 DIAGNOSIS — H401133 Primary open-angle glaucoma, bilateral, severe stage: Secondary | ICD-10-CM | POA: Diagnosis not present

## 2024-02-29 DIAGNOSIS — H353131 Nonexudative age-related macular degeneration, bilateral, early dry stage: Secondary | ICD-10-CM | POA: Diagnosis not present

## 2024-02-29 DIAGNOSIS — Z961 Presence of intraocular lens: Secondary | ICD-10-CM | POA: Diagnosis not present

## 2024-05-11 DIAGNOSIS — H4051X0 Glaucoma secondary to other eye disorders, right eye, stage unspecified: Secondary | ICD-10-CM | POA: Diagnosis not present

## 2024-05-11 DIAGNOSIS — H3411 Central retinal artery occlusion, right eye: Secondary | ICD-10-CM | POA: Diagnosis not present

## 2024-05-11 DIAGNOSIS — H26493 Other secondary cataract, bilateral: Secondary | ICD-10-CM | POA: Diagnosis not present

## 2024-05-11 DIAGNOSIS — H401123 Primary open-angle glaucoma, left eye, severe stage: Secondary | ICD-10-CM | POA: Diagnosis not present

## 2024-05-11 DIAGNOSIS — H348112 Central retinal vein occlusion, right eye, stable: Secondary | ICD-10-CM | POA: Diagnosis not present

## 2024-06-06 DIAGNOSIS — H401133 Primary open-angle glaucoma, bilateral, severe stage: Secondary | ICD-10-CM | POA: Diagnosis not present

## 2024-07-11 DIAGNOSIS — I10 Essential (primary) hypertension: Secondary | ICD-10-CM | POA: Diagnosis not present
